# Patient Record
Sex: Female | Born: 1945 | Race: White | Hispanic: No | Marital: Married | Smoking: Never smoker
Health system: Southern US, Community
[De-identification: ages and names within clinical notes are randomized; demographics above are authoritative.]

## PROBLEM LIST (undated history)

## (undated) DIAGNOSIS — R002 Palpitations: Secondary | ICD-10-CM

## (undated) DIAGNOSIS — I38 Endocarditis, valve unspecified: Secondary | ICD-10-CM

## (undated) DIAGNOSIS — E785 Hyperlipidemia, unspecified: Secondary | ICD-10-CM

## (undated) DIAGNOSIS — R569 Unspecified convulsions: Secondary | ICD-10-CM

## (undated) DIAGNOSIS — I10 Essential (primary) hypertension: Secondary | ICD-10-CM

## (undated) DIAGNOSIS — R079 Chest pain, unspecified: Secondary | ICD-10-CM

## (undated) DIAGNOSIS — I5189 Other ill-defined heart diseases: Secondary | ICD-10-CM

## (undated) DIAGNOSIS — I340 Nonrheumatic mitral (valve) insufficiency: Secondary | ICD-10-CM

## (undated) HISTORY — DX: Hyperlipidemia, unspecified: E78.5

## (undated) HISTORY — DX: Palpitations: R00.2

## (undated) HISTORY — PX: ABDOMINAL HYSTERECTOMY: SHX81

## (undated) HISTORY — DX: Nonrheumatic mitral (valve) insufficiency: I34.0

## (undated) HISTORY — DX: Endocarditis, valve unspecified: I38

## (undated) HISTORY — PX: TUBAL LIGATION: SHX77

## (undated) HISTORY — DX: Other ill-defined heart diseases: I51.89

## (undated) HISTORY — DX: Chest pain, unspecified: R07.9

---

## 1999-02-07 ENCOUNTER — Encounter: Payer: Self-pay | Admitting: Family Medicine

## 1999-02-07 ENCOUNTER — Ambulatory Visit (HOSPITAL_COMMUNITY): Admission: RE | Admit: 1999-02-07 | Discharge: 1999-02-07 | Payer: Self-pay | Admitting: Family Medicine

## 1999-08-30 ENCOUNTER — Ambulatory Visit (HOSPITAL_COMMUNITY): Admission: RE | Admit: 1999-08-30 | Discharge: 1999-08-30 | Payer: Self-pay | Admitting: Family Medicine

## 1999-08-30 ENCOUNTER — Encounter: Payer: Self-pay | Admitting: Family Medicine

## 2000-08-31 ENCOUNTER — Encounter: Payer: Self-pay | Admitting: Family Medicine

## 2000-08-31 ENCOUNTER — Ambulatory Visit (HOSPITAL_COMMUNITY): Admission: RE | Admit: 2000-08-31 | Discharge: 2000-08-31 | Payer: Self-pay | Admitting: Family Medicine

## 2000-11-19 ENCOUNTER — Other Ambulatory Visit: Admission: RE | Admit: 2000-11-19 | Discharge: 2000-11-19 | Payer: Self-pay | Admitting: Family Medicine

## 2001-09-05 ENCOUNTER — Encounter: Payer: Self-pay | Admitting: Family Medicine

## 2001-09-05 ENCOUNTER — Ambulatory Visit (HOSPITAL_COMMUNITY): Admission: RE | Admit: 2001-09-05 | Discharge: 2001-09-05 | Payer: Self-pay | Admitting: Family Medicine

## 2003-09-08 ENCOUNTER — Other Ambulatory Visit: Admission: RE | Admit: 2003-09-08 | Discharge: 2003-09-08 | Payer: Self-pay | Admitting: Family Medicine

## 2004-11-01 ENCOUNTER — Other Ambulatory Visit: Admission: RE | Admit: 2004-11-01 | Discharge: 2004-11-01 | Payer: Self-pay | Admitting: Family Medicine

## 2006-01-17 ENCOUNTER — Other Ambulatory Visit: Admission: RE | Admit: 2006-01-17 | Discharge: 2006-01-17 | Payer: Self-pay | Admitting: Family Medicine

## 2007-01-28 ENCOUNTER — Other Ambulatory Visit: Admission: RE | Admit: 2007-01-28 | Discharge: 2007-01-28 | Payer: Self-pay | Admitting: Family Medicine

## 2008-02-03 ENCOUNTER — Other Ambulatory Visit: Admission: RE | Admit: 2008-02-03 | Discharge: 2008-02-03 | Payer: Self-pay | Admitting: Family Medicine

## 2009-02-03 ENCOUNTER — Other Ambulatory Visit: Admission: RE | Admit: 2009-02-03 | Discharge: 2009-02-03 | Payer: Self-pay | Admitting: Family Medicine

## 2014-02-17 ENCOUNTER — Emergency Department (HOSPITAL_COMMUNITY)
Admission: EM | Admit: 2014-02-17 | Discharge: 2014-02-17 | Disposition: A | Discharge status: Home / Self Care | Payer: Worker's Compensation | Attending: Emergency Medicine | Admitting: Emergency Medicine

## 2014-02-17 ENCOUNTER — Encounter (HOSPITAL_COMMUNITY): Payer: Self-pay | Admitting: Emergency Medicine

## 2014-02-17 ENCOUNTER — Emergency Department (HOSPITAL_COMMUNITY): Payer: Worker's Compensation

## 2014-02-17 DIAGNOSIS — S60459A Superficial foreign body of unspecified finger, initial encounter: Secondary | ICD-10-CM

## 2014-02-17 DIAGNOSIS — Y9389 Activity, other specified: Secondary | ICD-10-CM | POA: Insufficient documentation

## 2014-02-17 DIAGNOSIS — Y929 Unspecified place or not applicable: Secondary | ICD-10-CM | POA: Diagnosis not present

## 2014-02-17 DIAGNOSIS — W268XXA Contact with other sharp object(s), not elsewhere classified, initial encounter: Secondary | ICD-10-CM | POA: Diagnosis not present

## 2014-02-17 DIAGNOSIS — I10 Essential (primary) hypertension: Secondary | ICD-10-CM | POA: Diagnosis not present

## 2014-02-17 DIAGNOSIS — Z7982 Long term (current) use of aspirin: Secondary | ICD-10-CM | POA: Insufficient documentation

## 2014-02-17 DIAGNOSIS — Z79899 Other long term (current) drug therapy: Secondary | ICD-10-CM | POA: Diagnosis not present

## 2014-02-17 HISTORY — DX: Unspecified convulsions: R56.9

## 2014-02-17 HISTORY — DX: Essential (primary) hypertension: I10

## 2014-02-17 NOTE — ED Notes (Signed)
Per pt at work, dropped needle.  Went to grab and needle stuck into right index finger.  Object remains.  Hook imbedded at the end.

## 2014-02-17 NOTE — Discharge Instructions (Signed)
Sliver Removal °You have had a sliver (splinter) removed. This has caused a wound that extends through some or all layers of the skin and possibly into the subcutaneous tissue. This is the tissue just beneath the skin. Because these wounds can not be cleaned well, it is necessary to watch closely for infection. °AFTER THE PROCEDURE  °If a cut (incision) was necessary to remove this, it may have been repaired for you by your caregiver either with suturing, stapling, or adhesive strips. These keep together the skin edges and allow better and faster healing. °HOME CARE INSTRUCTIONS  °· A dressing may have been applied. This may be changed once per day or as instructed. If the dressing sticks, it may be soaked off with a gauze pad or clean cloth that has been dampened with soapy water or hydrogen peroxide. °· It is difficult to remove all slivers or foreign bodies as they may break or splinter into smaller pieces. Be aware that your body will work to remove the foreign substance. That is, the foreign body may work itself out of the wound. That is normal. °· Watch for signs of infection and notify your caregiver if you suspect a sliver or foreign body remains in the wound. °· You may have received a recommendation to follow up with your physician or a specialist. It is very important to call for or keep follow-up appointments in order to avoid infection or other complications. °· Only take over-the-counter or prescription medicines for pain, discomfort, or fever as directed by your caregiver. °· If antibiotics were prescribed, be sure to finish all of the medicine. °If you did not receive a tetanus shot today because you did not recall when your last one was given, check with your caregiver in the next day or two during follow up to determine if one is needed. °SEEK MEDICAL CARE IF:  °· The area around the wound has new or worsening redness or tenderness. °· Pus is coming from the wound °· There is a foul smell from the  wound or dressing °· The edges of a wound that had been repaired break open °SEEK IMMEDIATE MEDICAL CARE IF:  °· Red streaks are coming from the wound °· An unexplained oral temperature above 102° F (38.9° C) develops. °Document Released: 08/11/2000 Document Revised: 11/06/2011 Document Reviewed: 03/30/2008 °[employer]® Patient Information ©2015 [employer]. This information is not intended to replace advice given to you by your health care provider. Make sure you discuss any questions you have with your health care provider. ° °

## 2014-02-17 NOTE — ED Provider Notes (Signed)
CSN: [id number]     Arrival date & time 02/17/14  0755 History   First MD Initiated Contact with Patient 02/17/14 0818     Chief Complaint  Patient presents with  . Foreign Body  . finger numbness      (Consider location/radiation/quality/duration/timing/severity/associated sxs/prior Treatment) Patient is a 68 y.o. female presenting with foreign body. The history is provided by the patient.  Foreign Body  patient has a knitting hook stuck in her right index finger. She states she was dropping and reached for it it got stuck in her hand. Tetanus is up-to-date. She states she has some numbness in her index finger. No other injury. She is able to move the finger.  Past Medical History  Diagnosis Date  . Hypertension   . Seizures    Past Surgical History  Procedure Laterality Date  . Abdominal hysterectomy     History reviewed. No pertinent family history. History  Substance Use Topics  . Smoking status: Never Smoker   . Smokeless tobacco: Not on file  . Alcohol Use: No   OB History   Grav Para Term Preterm Abortions TAB SAB Ect Mult Living                 Review of Systems  Constitutional: Negative for fever.  Musculoskeletal: Negative for joint swelling.  Skin: Positive for wound.  Neurological: Positive for numbness. Negative for weakness.      Allergies  Review of patient's allergies indicates no known allergies.  Home Medications   Prior to Admission medications   Medication Sig Start Date End Date Taking? Authorizing Provider  amLODipine (NORVASC) 5 MG tablet Take 5 mg by mouth every morning.   Yes Historical Provider, MD  aspirin EC 81 MG tablet Take 81 mg by mouth every morning.   Yes Historical Provider, MD  carbamazepine (TEGRETOL XR) 200 MG 12 hr tablet Take 200 mg by mouth at bedtime.   Yes Historical Provider, MD  carvedilol (COREG) 3.125 MG tablet Take 3.125 mg by mouth 2 (two) times daily with a meal.   Yes Historical Provider, MD  ferrous fumarate  (HEMOCYTE - 106 MG FE) 325 (106 FE) MG TABS tablet Take 1 tablet by mouth every morning.   Yes Historical Provider, MD  valsartan (DIOVAN) 320 MG tablet Take 320 mg by mouth at bedtime.   Yes Historical Provider, MD   BP 185/79  Pulse 55  Temp(Src) 97.9 F (36.6 C) (Oral)  Resp 18  SpO2 100% Physical Exam  Constitutional: She is oriented to person, place, and time. She appears well-developed.  Cardiovascular: Normal rate and regular rhythm.   Pulmonary/Chest: Effort normal and breath sounds normal.  Musculoskeletal:  Foreign body into 4 aspect of right index finger at the PIP joint. Able to flex and extend at the PIP and DIP joint. Sensation grossly intact to possibly mildly decreased over distal index finger. Joints are stable.  Neurological: She is alert and oriented to person, place, and time.    ED Course  FOREIGN BODY REMOVAL Date/Time: 02/17/2014 9:00 AM Performed by: Benjiman CorePICKERING, NATHAN R. Authorized by: Billee CashingPICKERING, NATHAN R. Consent: Verbal consent obtained. written consent not obtained. Risks and benefits: risks, benefits and alternatives were discussed Consent given by: patient Patient understanding: patient states understanding of the procedure being performed Patient consent: the patient's understanding of the procedure matches consent given Relevant documents: relevant documents present and verified Site marked: the operative site was marked Imaging studies: imaging studies available Required items: required blood  products, implants, devices, and special equipment available Patient identity confirmed: verbally with patient Time out: Immediately prior to procedure a "time out" was called to verify the correct patient, procedure, equipment, support staff and site/side marked as required. Body area: skin General location: upper extremity Location details: right index finger Anesthesia: digital block Local anesthetic: lidocaine 2% without epinephrine Anesthetic total: 1.5  ml Patient sedated: no Patient restrained: no Patient cooperative: yes Localization method: visualized Removal mechanism: 18-gauge needle inserted a long course of hook to cover barb in both removed with traction. Dressing: antibiotic ointment and dressing applied Tendon involvement: none Depth: subcutaneous Complexity: simple 1 objects recovered. Objects recovered: Knitting hook Post-procedure assessment: foreign body removed Patient tolerance: Patient tolerated the procedure well with no immediate complications.   (including critical care time) Labs Review Labs Reviewed - No data to display  Imaging Review Dg Finger Index Right  02/17/2014   CLINICAL DATA:  68 year old female with penetrating trauma, knitting hook impacted in the right index finger. Initial encounter.  EXAM: RIGHT INDEX FINGER 2+V  COMPARISON:  None.  FINDINGS: Small metallic E elongated hook projects at the ulnar aspect of the second finger, terminating at the ulnar base of the second middle phalanx near the PIP. No fracture of the adjacent osseous structures. No subcutaneous gas identified. Joint spaces and alignment are preserved.  IMPRESSION: Impacted metallic hook, deep aspect adjacent to the base of the second proximal phalanx. No underlying fracture.   Electronically Signed   By: Augusto GambleLee  Hall M.D.   On: 02/17/2014 08:40     EKG Interpretation None      MDM   Final diagnoses:  Foreign body of finger of right hand, initial encounter    Patient was knitting put in finger. No apparent joint involvement. Tendon function appears intact. Gait did not move with flexion-extension of the finger while it was in place. Will discharge home. Tetanus is up-to-date    Juliet Rudeathan R. Rubin PayorPickering, MD 02/17/14 [phone]

## 2015-02-02 ENCOUNTER — Other Ambulatory Visit: Payer: Self-pay | Admitting: Family Medicine

## 2015-02-02 DIAGNOSIS — M79661 Pain in right lower leg: Secondary | ICD-10-CM

## 2015-02-02 DIAGNOSIS — M79662 Pain in left lower leg: Secondary | ICD-10-CM

## 2015-02-08 ENCOUNTER — Ambulatory Visit
Admission: RE | Admit: 2015-02-08 | Discharge: 2015-02-08 | Disposition: A | Discharge status: Home / Self Care | Payer: 59 | Source: Ambulatory Visit | Attending: Family Medicine | Admitting: Family Medicine

## 2015-02-08 DIAGNOSIS — M79661 Pain in right lower leg: Secondary | ICD-10-CM

## 2015-02-08 DIAGNOSIS — M79662 Pain in left lower leg: Secondary | ICD-10-CM

## 2015-02-10 ENCOUNTER — Encounter (HOSPITAL_COMMUNITY): Payer: Self-pay | Admitting: Emergency Medicine

## 2015-02-10 ENCOUNTER — Emergency Department (HOSPITAL_COMMUNITY)
Admission: EM | Admit: 2015-02-10 | Discharge: 2015-02-10 | Disposition: A | Discharge status: Home / Self Care | Payer: 59 | Attending: Emergency Medicine | Admitting: Emergency Medicine

## 2015-02-10 DIAGNOSIS — Z79899 Other long term (current) drug therapy: Secondary | ICD-10-CM | POA: Insufficient documentation

## 2015-02-10 DIAGNOSIS — H538 Other visual disturbances: Secondary | ICD-10-CM | POA: Diagnosis not present

## 2015-02-10 DIAGNOSIS — G40909 Epilepsy, unspecified, not intractable, without status epilepticus: Secondary | ICD-10-CM | POA: Diagnosis not present

## 2015-02-10 DIAGNOSIS — Z7982 Long term (current) use of aspirin: Secondary | ICD-10-CM | POA: Insufficient documentation

## 2015-02-10 DIAGNOSIS — I10 Essential (primary) hypertension: Secondary | ICD-10-CM | POA: Diagnosis not present

## 2015-02-10 DIAGNOSIS — R42 Dizziness and giddiness: Secondary | ICD-10-CM | POA: Diagnosis present

## 2015-02-10 DIAGNOSIS — I160 Hypertensive urgency: Secondary | ICD-10-CM

## 2015-02-10 LAB — BASIC METABOLIC PANEL
Anion gap: 6 (ref 5–15)
BUN: 8 mg/dL (ref 6–20)
CALCIUM: 9.4 mg/dL (ref 8.9–10.3)
CHLORIDE: 99 mmol/L — AB (ref 101–111)
CO2: 29 mmol/L (ref 22–32)
CREATININE: 0.71 mg/dL (ref 0.44–1.00)
GFR calc Af Amer: >60 mL/min (ref >60)
GFR calc non Af Amer: >60 mL/min (ref >60)
GLUCOSE: 114 mg/dL — AB (ref 65–99)
Potassium: 3.9 mmol/L (ref 3.5–5.1)
Sodium: 134 mmol/L — ABNORMAL LOW (ref 135–145)

## 2015-02-10 LAB — CBC
HEMATOCRIT: 41.5 % (ref 36.0–46.0)
Hemoglobin: 14.5 g/dL (ref 12.0–15.0)
MCH: 30 pg (ref 26.0–34.0)
MCHC: 34.9 g/dL (ref 30.0–36.0)
MCV: 85.9 fL (ref 78.0–100.0)
PLATELETS: 206 10*3/uL (ref 150–400)
RBC: 4.83 MIL/uL (ref 3.87–5.11)
RDW: 13 % (ref 11.5–15.5)
WBC: 6 10*3/uL (ref 4.0–10.5)

## 2015-02-10 LAB — I-STAT TROPONIN, ED: TROPONIN I, POC: 0 ng/mL (ref 0.00–0.08)

## 2015-02-10 LAB — BRAIN NATRIURETIC PEPTIDE: B NATRIURETIC PEPTIDE 5: 107.3 pg/mL — AB (ref 0.0–100.0)

## 2015-02-10 NOTE — ED Provider Notes (Signed)
CSN: [id number]     Arrival date & time 02/10/15  1304 History   First MD Initiated Contact with Patient 02/10/15 1317     Chief Complaint  Patient presents with  . Dizziness  . Blurred Vision   HPI Heidi Carter is a 69yo woman with PMHx of HTN and seizures who presents to the ED with lightheadedness and blurry vision in both eyes. Patient reports her symptoms started around 12pm while she was at work. She notes someone at work checked her BP and the systolic number was >200. She states her symptoms have completely resolved now. She reports she takes BP medications and took her last dose last night. She denies any chest pain, but does report some shortness of breath, abdominal pain, and nausea. She denies any weakness, changes in speech, numbness, or tingling.    Past Medical History  Diagnosis Date  . Hypertension   . Seizures    Past Surgical History  Procedure Laterality Date  . Abdominal hysterectomy     History reviewed. No pertinent family history. History  Substance Use Topics  . Smoking status: Never Smoker   . Smokeless tobacco: Not on file  . Alcohol Use: No   OB History    No data available     Review of Systems General: Denies fever, chills, night sweats, changes in weight, changes in appetite HEENT: Denies headaches, ear pain, rhinorrhea, sore throat CV: Denies palpitations, orthopnea Pulm: Denies cough, wheezing GI: Denies diarrhea, constipation, melena, hematochezia GU: Denies dysuria, hematuria, frequency Msk: Denies muscle cramps, joint pains Neuro: See HPI Skin: Denies rashes, bruising   Allergies  Review of patient's allergies indicates no known allergies.  Home Medications   Prior to Admission medications   Medication Sig Start Date End Date Taking? Authorizing Provider  amLODipine (NORVASC) 5 MG tablet Take 5 mg by mouth every morning.    Historical Provider, MD  aspirin EC 81 MG tablet Take 81 mg by mouth every morning.    Historical Provider,  MD  carbamazepine (TEGRETOL XR) 200 MG 12 hr tablet Take 200 mg by mouth at bedtime.    Historical Provider, MD  carvedilol (COREG) 3.125 MG tablet Take 3.125 mg by mouth 2 (two) times daily with a meal.    Historical Provider, MD  ferrous fumarate (HEMOCYTE - 106 MG FE) 325 (106 FE) MG TABS tablet Take 1 tablet by mouth every morning.    Historical Provider, MD  valsartan (DIOVAN) 320 MG tablet Take 320 mg by mouth at bedtime.    Historical Provider, MD   BP 165/106 mmHg  Pulse 57  Temp(Src) 98.2 F (36.8 C) (Oral)  Resp 16  SpO2 100% Physical Exam General: elderly woman sitting up in bed, NAD HEENT: [place]/AT, EOMI, PERRL. No retinal hemorrhages observed on ophtho exam. Mucus membranes moist.  CV: RRR, no m/g/r Pulm: CTA bilaterally, breaths non-labored Abd: BS+, soft, non-tender, non-distended Ext: warm, no edema Neuro: alert and oriented x 3. EOMI. No visual field defects. Smile symmetric. Tongue does not deviate. Shoulder shrug intact. Finger to nose normal. Strength 5/5 in upper and lower extremities bilaterally. Sensation to light touch symmetric and intact.   ED Course  Procedures (including critical care time) Labs Review Labs Reviewed  BASIC METABOLIC PANEL - Abnormal; Notable for the following:    Sodium 134 (*)    Chloride 99 (*)    Glucose, Bld 114 (*)    All other components within normal limits  BRAIN NATRIURETIC PEPTIDE - Abnormal; Notable  for the following:    B Natriuretic Peptide 107.3 (*)    All other components within normal limits  CBC  I-STAT TROPOININ, ED    Imaging Review US Arterial Seg Multiple  02/09/2015   CLINICAL DATA:  69 year old female with bilateral (left greater than right) calf pain while walking which is relieved with rest consistent with claudication.  EXAM: NONINVASIVE PHYSIOLOGIC VASCULAR STUDY OF BILATERAL LOWER EXTREMITIES  TECHNIQUE: Evaluation of both lower extremities was performed at rest, including calculation of ankle-brachial  indices, multiple segmental pressure evaluation, segmental Doppler and segmental pulse volume recording.  COMPARISON:  None.  FINDINGS: Right ABI:  1.1  Left ABI:  1.1  Right Lower Extremity: Normal triphasic waveforms throughout the right lower extremity. No focal pressure differential on the segmental pressures to suggest focal stenosis. Normal PVRs.  Left Lower Extremity: Normal triphasic waveforms throughout the left lower extremity. No focal pressure differential on the segmental pressures to suggest focal stenosis. Normal PVRs.  IMPRESSION: Normal bilateral ankle-brachial indices, arterial waveforms, segmental pressures and pulse volume recordings. No evidence of clinically significant peripheral arterial disease.  Signed,  Sterling Big, MD  [facility]  [facility]   Electronically Signed   By: Malachy Moan M.D.   On: 02/09/2015 08:36     EKG Interpretation   Date/Time:  Wednesday February 10 2015 13:08:37 EDT Ventricular Rate:  57 PR Interval:  148 QRS Duration: 78 QT Interval:  414 QTC Calculation: 402 R Axis:   60 Text Interpretation:  Sinus bradycardia Possible Left atrial enlargement  Nonspecific ST abnormality Abnormal ECG No old tracing to compare  Confirmed by GOLDSTON  MD, SCOTT ([id number]) on 02/10/2015 1:28:44 PM      MDM   Final diagnoses:  Hypertensive urgency    69yo woman presenting with lightheadedness and bilateral blurry vision after having a transient blood pressure elevation to greater than 200 systolic. Symptoms have completely resolved now. Neurological exam nonfocal. However, EKG with sinus bradycardia and some ST depressions in the inferior and lateral leads. Troponin negative x 1. BP is stable in 130s-140s systolic.   Repeat EKG shows persistent changes.   Her symptoms were likely related to hypertensive urgency. Discussed EKG changes with cardiology and recommend for patient to have an echocardiogram in the  next few weeks and to keep BP controlled. Patient recommended to see her primary care doctor in the next 1-2 weeks for BP control.    Su Hoff, MD 02/10/15 1542  Pricilla Loveless, MD 02/11/15 [phone]

## 2015-02-10 NOTE — ED Notes (Signed)
Pt reports she was at work began feeling lightheaded around 12pm, experienced blurry vision. Did not pass out. Stroke scale neg. Pt awake, alert, oriented x4, vision improved from earlier.

## 2015-02-10 NOTE — Discharge Instructions (Signed)
- Your symptoms of blurry vision and lightheadedness were related to your high blood pressure. - You will need to have an echocardiogram within the next few weeks - Blood pressure control will be very important for controlling your symptoms - Please see your primary care doctor in the next 1-2 weeks  Hypertension Hypertension, commonly called high blood pressure, is when the force of blood pumping through your arteries is too strong. Your arteries are the blood vessels that carry blood from your heart throughout your body. A blood pressure reading consists of a higher number over a lower number, such as 110/72. The higher number (systolic) is the pressure inside your arteries when your heart pumps. The lower number (diastolic) is the pressure inside your arteries when your heart relaxes. Ideally you want your blood pressure below 120/80. Hypertension forces your heart to work harder to pump blood. Your arteries may become narrow or stiff. Having hypertension puts you at risk for heart disease, stroke, and other problems.  RISK FACTORS Some risk factors for high blood pressure are controllable. Others are not.  Risk factors you cannot control include:   Race. You may be at higher risk if you are African American.  Age. Risk increases with age.  Gender. Men are at higher risk than women before age 26 years. After age 53, women are at higher risk than men. Risk factors you can control include:  Not getting enough exercise or physical activity.  Being overweight.  Getting too much fat, sugar, calories, or salt in your diet.  Drinking too much alcohol. SIGNS AND SYMPTOMS Hypertension does not usually cause signs or symptoms. Extremely high blood pressure (hypertensive crisis) may cause headache, anxiety, shortness of breath, and nosebleed. DIAGNOSIS  To check if you have hypertension, your health care provider will measure your blood pressure while you are seated, with your arm held at the  level of your heart. It should be measured at least twice using the same arm. Certain conditions can cause a difference in blood pressure between your right and left arms. A blood pressure reading that is higher than normal on one occasion does not mean that you need treatment. If one blood pressure reading is high, ask your health care provider about having it checked again. TREATMENT  Treating high blood pressure includes making lifestyle changes and possibly taking medicine. Living a healthy lifestyle can help lower high blood pressure. You may need to change some of your habits. Lifestyle changes may include:  Following the DASH diet. This diet is high in fruits, vegetables, and whole grains. It is low in salt, red meat, and added sugars.  Getting at least 2 hours of brisk physical activity every week.  Losing weight if necessary.  Not smoking.  Limiting alcoholic beverages.  Learning ways to reduce stress. If lifestyle changes are not enough to get your blood pressure under control, your health care provider may prescribe medicine. You may need to take more than one. Work closely with your health care provider to understand the risks and benefits. HOME CARE INSTRUCTIONS  Have your blood pressure rechecked as directed by your health care provider.   Take medicines only as directed by your health care provider. Follow the directions carefully. Blood pressure medicines must be taken as prescribed. The medicine does not work as well when you skip doses. Skipping doses also puts you at risk for problems.   Do not smoke.   Monitor your blood pressure at home as directed by your health  care provider. SEEK MEDICAL CARE IF:   You think you are having a reaction to medicines taken.  You have recurrent headaches or feel dizzy.  You have swelling in your ankles.  You have trouble with your vision. SEEK IMMEDIATE MEDICAL CARE IF:  You develop a severe headache or confusion.  You  have unusual weakness, numbness, or feel faint.  You have severe chest or abdominal pain.  You vomit repeatedly.  You have trouble breathing. MAKE SURE YOU:   Understand these instructions.  Will watch your condition.  Will get help right away if you are not doing well or get worse. Document Released: 08/14/2005 Document Revised: 12/29/2013 Document Reviewed: 06/06/2013 [facility] Patient Information 2015 [place], [place]. This information is not intended to replace advice given to you by your health care provider. Make sure you discuss any questions you have with your health care provider.

## 2015-03-23 ENCOUNTER — Other Ambulatory Visit: Payer: Self-pay | Admitting: Cardiology

## 2015-03-23 ENCOUNTER — Ambulatory Visit (HOSPITAL_COMMUNITY)
Admission: RE | Admit: 2015-03-23 | Discharge: 2015-03-23 | Disposition: A | Discharge status: Home / Self Care | Payer: 59 | Source: Ambulatory Visit | Attending: Vascular Surgery | Admitting: Vascular Surgery

## 2015-03-23 DIAGNOSIS — I10 Essential (primary) hypertension: Secondary | ICD-10-CM | POA: Insufficient documentation

## 2015-03-23 DIAGNOSIS — R569 Unspecified convulsions: Secondary | ICD-10-CM | POA: Insufficient documentation

## 2015-07-05 ENCOUNTER — Other Ambulatory Visit: Payer: Self-pay

## 2015-07-05 ENCOUNTER — Ambulatory Visit
Admission: RE | Admit: 2015-07-05 | Discharge: 2015-07-05 | Disposition: A | Discharge status: Home / Self Care | Payer: 59 | Source: Ambulatory Visit

## 2015-07-05 DIAGNOSIS — Z1231 Encounter for screening mammogram for malignant neoplasm of breast: Secondary | ICD-10-CM

## 2016-07-31 DIAGNOSIS — I119 Hypertensive heart disease without heart failure: Secondary | ICD-10-CM | POA: Diagnosis not present

## 2016-07-31 DIAGNOSIS — I351 Nonrheumatic aortic (valve) insufficiency: Secondary | ICD-10-CM | POA: Diagnosis not present

## 2016-07-31 DIAGNOSIS — R9431 Abnormal electrocardiogram [ECG] [EKG]: Secondary | ICD-10-CM | POA: Diagnosis not present

## 2016-07-31 DIAGNOSIS — I10 Essential (primary) hypertension: Secondary | ICD-10-CM | POA: Diagnosis not present

## 2016-09-06 ENCOUNTER — Other Ambulatory Visit: Payer: Self-pay | Admitting: Family Medicine

## 2016-09-06 DIAGNOSIS — Z1231 Encounter for screening mammogram for malignant neoplasm of breast: Secondary | ICD-10-CM

## 2016-09-19 DIAGNOSIS — E782 Mixed hyperlipidemia: Secondary | ICD-10-CM | POA: Diagnosis not present

## 2016-09-19 DIAGNOSIS — R7303 Prediabetes: Secondary | ICD-10-CM | POA: Diagnosis not present

## 2016-09-19 DIAGNOSIS — Z0000 Encounter for general adult medical examination without abnormal findings: Secondary | ICD-10-CM | POA: Diagnosis not present

## 2016-09-19 DIAGNOSIS — I10 Essential (primary) hypertension: Secondary | ICD-10-CM | POA: Diagnosis not present

## 2016-09-19 DIAGNOSIS — G40909 Epilepsy, unspecified, not intractable, without status epilepticus: Secondary | ICD-10-CM | POA: Diagnosis not present

## 2016-09-19 DIAGNOSIS — Z1389 Encounter for screening for other disorder: Secondary | ICD-10-CM | POA: Diagnosis not present

## 2016-10-09 ENCOUNTER — Encounter: Payer: Self-pay | Admitting: Radiology

## 2016-10-09 ENCOUNTER — Ambulatory Visit
Admission: RE | Admit: 2016-10-09 | Discharge: 2016-10-09 | Disposition: A | Discharge status: Home / Self Care | Payer: Medicare HMO | Source: Ambulatory Visit | Attending: Family Medicine | Admitting: Family Medicine

## 2016-10-09 DIAGNOSIS — Z1231 Encounter for screening mammogram for malignant neoplasm of breast: Secondary | ICD-10-CM | POA: Diagnosis not present

## 2016-11-17 DIAGNOSIS — E7800 Pure hypercholesterolemia, unspecified: Secondary | ICD-10-CM | POA: Diagnosis not present

## 2016-11-28 DIAGNOSIS — Z1211 Encounter for screening for malignant neoplasm of colon: Secondary | ICD-10-CM | POA: Diagnosis not present

## 2017-04-18 DIAGNOSIS — E782 Mixed hyperlipidemia: Secondary | ICD-10-CM | POA: Diagnosis not present

## 2017-04-18 DIAGNOSIS — K219 Gastro-esophageal reflux disease without esophagitis: Secondary | ICD-10-CM | POA: Diagnosis not present

## 2017-04-18 DIAGNOSIS — I10 Essential (primary) hypertension: Secondary | ICD-10-CM | POA: Diagnosis not present

## 2017-04-18 DIAGNOSIS — Z23 Encounter for immunization: Secondary | ICD-10-CM | POA: Diagnosis not present

## 2017-04-18 DIAGNOSIS — R7303 Prediabetes: Secondary | ICD-10-CM | POA: Diagnosis not present

## 2017-04-18 DIAGNOSIS — G40909 Epilepsy, unspecified, not intractable, without status epilepticus: Secondary | ICD-10-CM | POA: Diagnosis not present

## 2017-06-11 DIAGNOSIS — H18413 Arcus senilis, bilateral: Secondary | ICD-10-CM | POA: Diagnosis not present

## 2017-06-11 DIAGNOSIS — H11423 Conjunctival edema, bilateral: Secondary | ICD-10-CM | POA: Diagnosis not present

## 2017-06-11 DIAGNOSIS — H04123 Dry eye syndrome of bilateral lacrimal glands: Secondary | ICD-10-CM | POA: Diagnosis not present

## 2017-06-11 DIAGNOSIS — H11153 Pinguecula, bilateral: Secondary | ICD-10-CM | POA: Diagnosis not present

## 2017-06-11 DIAGNOSIS — H5203 Hypermetropia, bilateral: Secondary | ICD-10-CM | POA: Diagnosis not present

## 2017-06-11 DIAGNOSIS — H40033 Anatomical narrow angle, bilateral: Secondary | ICD-10-CM | POA: Diagnosis not present

## 2017-06-11 DIAGNOSIS — H2513 Age-related nuclear cataract, bilateral: Secondary | ICD-10-CM | POA: Diagnosis not present

## 2017-06-11 DIAGNOSIS — H25013 Cortical age-related cataract, bilateral: Secondary | ICD-10-CM | POA: Diagnosis not present

## 2017-06-11 DIAGNOSIS — H40023 Open angle with borderline findings, high risk, bilateral: Secondary | ICD-10-CM | POA: Diagnosis not present

## 2017-07-09 DIAGNOSIS — H40033 Anatomical narrow angle, bilateral: Secondary | ICD-10-CM | POA: Diagnosis not present

## 2017-07-09 DIAGNOSIS — H40013 Open angle with borderline findings, low risk, bilateral: Secondary | ICD-10-CM | POA: Diagnosis not present

## 2017-07-09 DIAGNOSIS — H2513 Age-related nuclear cataract, bilateral: Secondary | ICD-10-CM | POA: Diagnosis not present

## 2017-08-08 DIAGNOSIS — M791 Myalgia, unspecified site: Secondary | ICD-10-CM | POA: Diagnosis not present

## 2017-08-27 DIAGNOSIS — I351 Nonrheumatic aortic (valve) insufficiency: Secondary | ICD-10-CM | POA: Diagnosis not present

## 2017-08-27 DIAGNOSIS — I119 Hypertensive heart disease without heart failure: Secondary | ICD-10-CM | POA: Diagnosis not present

## 2017-09-19 ENCOUNTER — Other Ambulatory Visit: Payer: Self-pay | Admitting: Family Medicine

## 2017-09-19 DIAGNOSIS — Z23 Encounter for immunization: Secondary | ICD-10-CM | POA: Diagnosis not present

## 2017-09-19 DIAGNOSIS — R7303 Prediabetes: Secondary | ICD-10-CM | POA: Diagnosis not present

## 2017-09-19 DIAGNOSIS — Z13820 Encounter for screening for osteoporosis: Secondary | ICD-10-CM | POA: Diagnosis not present

## 2017-09-19 DIAGNOSIS — E782 Mixed hyperlipidemia: Secondary | ICD-10-CM | POA: Diagnosis not present

## 2017-09-19 DIAGNOSIS — Z0000 Encounter for general adult medical examination without abnormal findings: Secondary | ICD-10-CM | POA: Diagnosis not present

## 2017-09-19 DIAGNOSIS — Z1231 Encounter for screening mammogram for malignant neoplasm of breast: Secondary | ICD-10-CM

## 2017-09-19 DIAGNOSIS — G40909 Epilepsy, unspecified, not intractable, without status epilepticus: Secondary | ICD-10-CM | POA: Diagnosis not present

## 2017-09-19 DIAGNOSIS — Z1389 Encounter for screening for other disorder: Secondary | ICD-10-CM | POA: Diagnosis not present

## 2017-09-19 DIAGNOSIS — I10 Essential (primary) hypertension: Secondary | ICD-10-CM | POA: Diagnosis not present

## 2017-10-11 ENCOUNTER — Ambulatory Visit
Admission: RE | Admit: 2017-10-11 | Discharge: 2017-10-11 | Disposition: A | Discharge status: Home / Self Care | Payer: Medicare HMO | Source: Ambulatory Visit | Attending: Family Medicine | Admitting: Family Medicine

## 2017-10-11 DIAGNOSIS — Z1231 Encounter for screening mammogram for malignant neoplasm of breast: Secondary | ICD-10-CM | POA: Diagnosis not present

## 2018-01-29 DIAGNOSIS — S29012A Strain of muscle and tendon of back wall of thorax, initial encounter: Secondary | ICD-10-CM | POA: Diagnosis not present

## 2018-03-19 DIAGNOSIS — R531 Weakness: Secondary | ICD-10-CM | POA: Diagnosis not present

## 2018-03-19 DIAGNOSIS — E782 Mixed hyperlipidemia: Secondary | ICD-10-CM | POA: Diagnosis not present

## 2018-03-19 DIAGNOSIS — I10 Essential (primary) hypertension: Secondary | ICD-10-CM | POA: Diagnosis not present

## 2018-03-19 DIAGNOSIS — R200 Anesthesia of skin: Secondary | ICD-10-CM | POA: Diagnosis not present

## 2018-03-19 DIAGNOSIS — G40909 Epilepsy, unspecified, not intractable, without status epilepticus: Secondary | ICD-10-CM | POA: Diagnosis not present

## 2018-03-20 ENCOUNTER — Other Ambulatory Visit: Payer: Self-pay | Admitting: Family Medicine

## 2018-03-20 ENCOUNTER — Ambulatory Visit
Admission: RE | Admit: 2018-03-20 | Discharge: 2018-03-20 | Disposition: A | Discharge status: Home / Self Care | Payer: Medicare HMO | Source: Ambulatory Visit | Attending: Family Medicine | Admitting: Family Medicine

## 2018-03-20 DIAGNOSIS — R200 Anesthesia of skin: Secondary | ICD-10-CM

## 2018-03-20 DIAGNOSIS — M4186 Other forms of scoliosis, lumbar region: Secondary | ICD-10-CM | POA: Diagnosis not present

## 2018-05-21 DIAGNOSIS — Z23 Encounter for immunization: Secondary | ICD-10-CM | POA: Diagnosis not present

## 2018-05-21 DIAGNOSIS — I10 Essential (primary) hypertension: Secondary | ICD-10-CM | POA: Diagnosis not present

## 2018-07-17 ENCOUNTER — Ambulatory Visit: Payer: Self-pay | Admitting: Cardiology

## 2018-08-11 NOTE — Progress Notes (Signed)
Cardiology Office Note  Date:  08/12/2018   ID:  Heidi Carter, DOB 25-Apr-1946, MRN [id number]  PCP:  Heidi BrunetteSmith, Candace, MD   Chief Complaint  Patient presents with  . other    Former Heidi Carter c/o left underarm/right breat discomfort. Meds reviewed verbally with pt.    HPI:  Ms. Heidi Carter is a 72yo woman with PMHx of  HTN  seizures  Aortic valve regurgitation Who [facility], follow-up of her hypertension and aortic valve disease  Reports blood pressure at home typically in the 150 range over 70s Blood pressure elevated on last clinic visit with Dr. Tory Carter Told she needed to be on by bystolic.  She has been cutting the dose in half down to 2.5 mg daily Heart rate low at home, typically mid to low 50s Pulse rate 49 on today's visit  Occasionally with blurry vision in her eyes  Active, no regular exercise program  Review of prior echocardiogram from 2017 showing moderate aortic valve regurgitation normal ejection fraction Notes from Dr. Donnie Carter in December 2018 documenting mild to moderate AI  She reports that she takes all of her medications in the evening  EKG personally reviewed by myself on todays visit Shows sinus bradycardia rate 49 bpm nonspecific ST abnormality   PMH:   has a past medical history of Hyperlipidemia, Hypertension, Leaky heart valve, and Seizures (HCC).  PSH:    Past Surgical History:  Procedure Laterality Date  . ABDOMINAL HYSTERECTOMY      Current Outpatient Medications  Medication Sig Dispense Refill  . amLODipine (NORVASC) 10 MG tablet Take 10 mg by mouth daily.    Marland Kitchen. aspirin EC 81 MG tablet Take 81 mg by mouth every morning.    . carbamazepine (TEGRETOL) 200 MG tablet Take 200 mg by mouth daily.    . cetirizine (ZYRTEC) 10 MG tablet Take 10 mg by mouth daily.    . Cholecalciferol (VITAMIN D PO) Take 1 capsule by mouth daily.    . ferrous fumarate (HEMOCYTE - 106 MG FE) 325 (106 FE) MG TABS tablet Take 1  tablet by mouth every morning.    . nebivolol (BYSTOLIC) 5 MG tablet Take 5 mg by mouth daily.    . rosuvastatin (CRESTOR) 10 MG tablet Take 10 mg by mouth daily.    Marland Kitchen. spironolactone (ALDACTONE) 25 MG tablet Take 25 mg by mouth daily.    Marland Kitchen. telmisartan (MICARDIS) 80 MG tablet Take 80 mg by mouth daily.    Marland Kitchen. doxazosin (CARDURA) 2 MG tablet Take 1 tablet (2 mg total) by mouth daily. 30 tablet 11   No current facility-administered medications for this visit.      Allergies:   Patient has no known allergies.   Social History:  The patient  reports that she has never smoked. She has never used smokeless tobacco. She reports that she does not drink alcohol or use drugs.   Family History:   family history includes Clotting disorder in her sister.    Review of Systems: Review of Systems  Constitutional: Negative.   Respiratory: Negative.   Cardiovascular: Negative.   Gastrointestinal: Negative.   Musculoskeletal: Negative.   Neurological: Negative.   Psychiatric/Behavioral: Negative.   All other systems reviewed and are negative.    PHYSICAL EXAM: VS:  BP (!) 164/80 (BP Location: Right Arm, Patient Position: Sitting, Cuff Size: Normal)   Pulse (!) 49   Ht 5\' 1"  (1.549 m)   Wt 126 lb 4 oz (  57.3 kg)   BMI 23.85 kg/m  , BMI Body mass index is 23.85 kg/m. GEN: Well nourished, well developed, in no acute distress  HEENT: normal  Neck: no JVD, carotid bruits, or masses Cardiac: RRR; 1-2 diastolic murmur rsb, no  rubs, or gallops,no edema  Respiratory:  clear to auscultation bilaterally, normal work of breathing GI: soft, nontender, nondistended, + BS MS: no deformity or atrophy  Skin: warm and dry, no rash Neuro:  Strength and sensation are intact Psych: euthymic mood, full affect   Recent Labs: No results found for requested labs within last 8760 hours.    Lipid Panel No results found for: CHOL, HDL, LDLCALC, TRIG    Wt Readings from Last 3 Encounters:  08/12/18 126 lb 4  oz (57.3 kg)       ASSESSMENT AND PLAN:  Benign essential HTN - Lots of options for her blood pressure control Recommend she hold the beta-blocker given her bradycardia Other medication options given systolic pressure continues to run 150s includes Cardura, hydralazine, HCTZ Will avoid clonidine given bradycardia, try to avoid multiple pills with hydralazine, try to avoid 2 diuretics with HCTZ We did discuss in the future changing her Micardis to generic losartan given her high co-pay.  Also discussed possibly using a triple combination pill amlodipine/HCTZ/valsartan or other triple combo.  Triple combination pill was not started  Nonrheumatic aortic valve insufficiency - Echocardiogram ordered Prior echocardiogram 2017 with moderate aortic valve regurgitation She is asymptomatic, very mild murmur on exam Discussed valve pathology  Sinus bradycardia Recommend he stop the beta-blocker, bystolic, heart rate of 49 bpm Denies having any tachycardia or other arrhythmia  Disposition:   She will call us with blood pressure measurements We will also call her with the results of her echocardiogram  Long discussion concerning blood pressure pills, timing of her pills, various treatment options, types of medications available Discussed bradycardia, role of beta-blockers Discussed valve disease, previous records discussed with her Additional records requested  Total encounter time more than 45 minutes  Greater than 50% was spent in counseling and coordination of care with the patient     Orders Placed This Encounter  Procedures  . EKG 12-Lead  . ECHOCARDIOGRAM COMPLETE     Signed, Heidi Carter, M.D., Ph.D. 08/12/2018  [facility] [place], [place] [phone]

## 2018-08-12 ENCOUNTER — Encounter: Payer: Self-pay | Admitting: Cardiovascular Disease

## 2018-08-12 ENCOUNTER — Ambulatory Visit: Payer: Medicare HMO | Admitting: Cardiovascular Disease

## 2018-08-12 VITALS — BP 164/80 | HR 49 | Ht 61.0 in | Wt 126.2 lb

## 2018-08-12 DIAGNOSIS — I351 Nonrheumatic aortic (valve) insufficiency: Secondary | ICD-10-CM | POA: Diagnosis not present

## 2018-08-12 DIAGNOSIS — I10 Essential (primary) hypertension: Secondary | ICD-10-CM

## 2018-08-12 MED ORDER — DOXAZOSIN MESYLATE 2 MG PO TABS: 2.0000 mg | ORAL_TABLET | Freq: Every day | ORAL | 11 refills | Status: DC

## 2018-08-12 NOTE — Patient Instructions (Addendum)
Medication Instructions:   Please stop the bystolic, Heart rate is slow  Please call when you are running out of micardis, We can change back to valsartan or losartan  Please start cardura/doxazosin 2 mg once a day in the PM amlodpine in the Am Spironolactone in the Am Micardis in the PM   If you need a refill on your cardiac medications before your next appointment, please call your pharmacy.   Lab work: No new labs needed   If you have labs (blood work) drawn today and your tests are completely normal, you will receive your results only by: Marland Kitchen. MyChart Message (if you have MyChart) OR . A paper copy in the mail If you have any lab test that is abnormal or we need to change your treatment, we will call you to review the results.   Testing/Procedures: We will order an echocardiogram for aortic valve regurgitation   Follow-Up: At [facility], you and your health needs are our priority.  As part of our continuing mission to provide you with exceptional heart care, we have created designated Provider Care Teams.  These Care Teams include your primary Cardiologist (physician) and Advanced Practice Providers (APPs -  Physician Assistants and Nurse Practitioners) who all work together to provide you with the care you need, when you need it.  . You will need a follow up appointment in 12 months .   Please call our office 2 months in advance to schedule this appointment.    . Providers on your designated Care Team:   . Nicolasa Duckinghristopher Berge, NP . Eula Listenyan Dunn, PA-C . Marisue IvanJacquelyn Visser, PA-C  Any Other Special Instructions Will Be Listed Below (If Applicable).  For educational health videos Log in to : [email or web address] Or : [email or web address], password : [email or web address]

## 2018-08-19 ENCOUNTER — Ambulatory Visit (INDEPENDENT_AMBULATORY_CARE_PROVIDER_SITE_OTHER): Payer: Medicare HMO

## 2018-08-19 ENCOUNTER — Other Ambulatory Visit: Payer: Self-pay

## 2018-08-19 DIAGNOSIS — I351 Nonrheumatic aortic (valve) insufficiency: Secondary | ICD-10-CM

## 2018-09-16 ENCOUNTER — Telehealth: Payer: Self-pay | Admitting: Cardiology

## 2018-09-16 ENCOUNTER — Other Ambulatory Visit: Payer: Self-pay | Admitting: Cardiovascular Disease

## 2018-09-16 NOTE — Telephone Encounter (Signed)
Please Print last OV  

## 2018-09-16 NOTE — Telephone Encounter (Signed)
° °  1. Which medications need to be refilled? (please list name of each medication and dose if known) Spironolactone 25mg  tablet once daily  2. Which pharmacy/location (including street and city if local pharmacy) is medication to be sent to?[id number]  3. Do they need a 30 day or 90 day supply? 30

## 2018-09-18 ENCOUNTER — Other Ambulatory Visit: Payer: Self-pay | Admitting: Family Medicine

## 2018-09-18 DIAGNOSIS — Z1231 Encounter for screening mammogram for malignant neoplasm of breast: Secondary | ICD-10-CM

## 2018-09-25 DIAGNOSIS — I10 Essential (primary) hypertension: Secondary | ICD-10-CM | POA: Diagnosis not present

## 2018-09-25 DIAGNOSIS — K219 Gastro-esophageal reflux disease without esophagitis: Secondary | ICD-10-CM | POA: Diagnosis not present

## 2018-09-25 DIAGNOSIS — Z1389 Encounter for screening for other disorder: Secondary | ICD-10-CM | POA: Diagnosis not present

## 2018-09-25 DIAGNOSIS — Z0000 Encounter for general adult medical examination without abnormal findings: Secondary | ICD-10-CM | POA: Diagnosis not present

## 2018-09-25 DIAGNOSIS — E782 Mixed hyperlipidemia: Secondary | ICD-10-CM | POA: Diagnosis not present

## 2018-09-25 DIAGNOSIS — R7303 Prediabetes: Secondary | ICD-10-CM | POA: Diagnosis not present

## 2018-10-17 ENCOUNTER — Ambulatory Visit
Admission: RE | Admit: 2018-10-17 | Discharge: 2018-10-17 | Disposition: A | Discharge status: Home / Self Care | Payer: Medicare HMO | Source: Ambulatory Visit | Attending: Family Medicine | Admitting: Family Medicine

## 2018-10-17 DIAGNOSIS — Z1231 Encounter for screening mammogram for malignant neoplasm of breast: Secondary | ICD-10-CM

## 2018-12-13 DIAGNOSIS — M79605 Pain in left leg: Secondary | ICD-10-CM | POA: Diagnosis not present

## 2018-12-17 ENCOUNTER — Other Ambulatory Visit: Payer: Self-pay

## 2018-12-17 MED ORDER — DOXAZOSIN MESYLATE 2 MG PO TABS: 2.0000 mg | ORAL_TABLET | Freq: Every day | ORAL | 0 refills | Status: DC

## 2019-03-26 DIAGNOSIS — G40909 Epilepsy, unspecified, not intractable, without status epilepticus: Secondary | ICD-10-CM | POA: Diagnosis not present

## 2019-03-26 DIAGNOSIS — I10 Essential (primary) hypertension: Secondary | ICD-10-CM | POA: Diagnosis not present

## 2019-03-26 DIAGNOSIS — K219 Gastro-esophageal reflux disease without esophagitis: Secondary | ICD-10-CM | POA: Diagnosis not present

## 2019-03-26 DIAGNOSIS — R7303 Prediabetes: Secondary | ICD-10-CM | POA: Diagnosis not present

## 2019-03-26 DIAGNOSIS — E782 Mixed hyperlipidemia: Secondary | ICD-10-CM | POA: Diagnosis not present

## 2019-04-21 DIAGNOSIS — R7303 Prediabetes: Secondary | ICD-10-CM | POA: Diagnosis not present

## 2019-04-21 DIAGNOSIS — I10 Essential (primary) hypertension: Secondary | ICD-10-CM | POA: Diagnosis not present

## 2019-04-21 DIAGNOSIS — G40909 Epilepsy, unspecified, not intractable, without status epilepticus: Secondary | ICD-10-CM | POA: Diagnosis not present

## 2019-04-22 DIAGNOSIS — E782 Mixed hyperlipidemia: Secondary | ICD-10-CM | POA: Diagnosis not present

## 2019-04-22 DIAGNOSIS — I10 Essential (primary) hypertension: Secondary | ICD-10-CM | POA: Diagnosis not present

## 2019-04-22 DIAGNOSIS — E7800 Pure hypercholesterolemia, unspecified: Secondary | ICD-10-CM | POA: Diagnosis not present

## 2019-04-22 DIAGNOSIS — E785 Hyperlipidemia, unspecified: Secondary | ICD-10-CM | POA: Diagnosis not present

## 2019-04-22 DIAGNOSIS — F329 Major depressive disorder, single episode, unspecified: Secondary | ICD-10-CM | POA: Diagnosis not present

## 2019-05-13 DIAGNOSIS — F329 Major depressive disorder, single episode, unspecified: Secondary | ICD-10-CM | POA: Diagnosis not present

## 2019-05-13 DIAGNOSIS — E782 Mixed hyperlipidemia: Secondary | ICD-10-CM | POA: Diagnosis not present

## 2019-05-13 DIAGNOSIS — I10 Essential (primary) hypertension: Secondary | ICD-10-CM | POA: Diagnosis not present

## 2019-05-13 DIAGNOSIS — E7800 Pure hypercholesterolemia, unspecified: Secondary | ICD-10-CM | POA: Diagnosis not present

## 2019-05-13 DIAGNOSIS — E785 Hyperlipidemia, unspecified: Secondary | ICD-10-CM | POA: Diagnosis not present

## 2019-06-27 DIAGNOSIS — M5432 Sciatica, left side: Secondary | ICD-10-CM | POA: Diagnosis not present

## 2019-07-04 DIAGNOSIS — E7800 Pure hypercholesterolemia, unspecified: Secondary | ICD-10-CM | POA: Diagnosis not present

## 2019-07-04 DIAGNOSIS — I10 Essential (primary) hypertension: Secondary | ICD-10-CM | POA: Diagnosis not present

## 2019-07-04 DIAGNOSIS — E782 Mixed hyperlipidemia: Secondary | ICD-10-CM | POA: Diagnosis not present

## 2019-07-04 DIAGNOSIS — E785 Hyperlipidemia, unspecified: Secondary | ICD-10-CM | POA: Diagnosis not present

## 2019-07-04 DIAGNOSIS — F329 Major depressive disorder, single episode, unspecified: Secondary | ICD-10-CM | POA: Diagnosis not present

## 2019-08-11 NOTE — Progress Notes (Signed)
Cardiology Office Note  Date:  08/12/2019   ID:  Heidi Carter, DOB 19-Feb-1946, MRN [id number]  PCP:  Heidi Brunette, MD   Chief Complaint  Patient presents with  . Other    12 month follow up. patient c/o some off and on chest pain and SOB. Meds reviewed verbally with patient.     HPI:  Heidi Carter is a 73 yo woman with PMHx of  HTN  seizures  2017 showing moderate aortic valve regurgitation  2018 with mild to moderate AI Who presents for follow-up of her hypertension and aortic valve disease  Recent tragic loss of her daughter in early 2020 Because unclear  Otherwise reports she has been doing relatively well BP at home, 126/70, range up to 130  typically runs around that range Off bystolic, held last office visit for bradycardia Reports that she was given a prescription for spironolactone, has not started yet  Active with no regular exercise program  EKG personally reviewed by myself on todays visit Shows sinus bradycardia rate 57 bpm nonspecific ST abnormality, no change from EKG dating back to 2016  Review of prior echocardiogram from 2017 showing moderate aortic valve regurgitation normal ejection fraction Notes from Dr. Donnie Aho in December 2018 documenting mild to moderate AI   PMH:   has a past medical history of Hyperlipidemia, Hypertension, Leaky heart valve, and Seizures (HCC).  PSH:    Past Surgical History:  Procedure Laterality Date  . ABDOMINAL HYSTERECTOMY      Current Outpatient Medications  Medication Sig Dispense Refill  . amLODipine (NORVASC) 10 MG tablet Take 10 mg by mouth daily.    Marland Kitchen aspirin EC 81 MG tablet Take 81 mg by mouth every morning.    . carbamazepine (TEGRETOL) 200 MG tablet Take 200 mg by mouth daily.    . cetirizine (ZYRTEC) 10 MG tablet Take 10 mg by mouth daily.    . Cholecalciferol (VITAMIN D PO) Take 1 capsule by mouth daily.    Marland Kitchen doxazosin (CARDURA) 2 MG tablet Take 1 tablet (2 mg total) by mouth daily. 90 tablet 0  .  ferrous fumarate (HEMOCYTE - 106 MG FE) 325 (106 FE) MG TABS tablet Take 1 tablet by mouth every morning.    . rosuvastatin (CRESTOR) 10 MG tablet Take 10 mg by mouth daily.    Marland Kitchen telmisartan (MICARDIS) 80 MG tablet Take 80 mg by mouth daily.    Marland Kitchen spironolactone (ALDACTONE) 25 MG tablet TAKE 1 TABLET BY MOUTH EVERY DAY (Patient not taking: Reported on 08/12/2019) 90 tablet 3   No current facility-administered medications for this visit.     Allergies:   Patient has no known allergies.   Social History:  The patient  reports that she has never smoked. She has never used smokeless tobacco. She reports that she does not drink alcohol or use drugs.   Family History:   family history includes Clotting disorder in her sister.    Review of Systems: Review of Systems  Constitutional: Negative.   HENT: Negative.   Respiratory: Negative.   Cardiovascular: Negative.   Gastrointestinal: Negative.   Musculoskeletal: Negative.   Neurological: Negative.   Psychiatric/Behavioral: Negative.   All other systems reviewed and are negative.   PHYSICAL EXAM: VS:  BP (!) 150/62 (BP Location: Left Arm, Patient Position: Sitting, Cuff Size: Normal)   Pulse (!) 57   Ht 5' (1.524 m)   Wt 124 lb 4 oz (56.4 kg)   SpO2 99%   BMI 24.27  kg/m  , BMI Body mass index is 24.27 kg/m. Constitutional:  oriented to person, place, and time. No distress.  HENT:  Head: Grossly normal Eyes:  no discharge. No scleral icterus.  Neck: No JVD, no carotid bruits  Cardiovascular: Regular rate and rhythm, no murmurs appreciated Pulmonary/Chest: Clear to auscultation bilaterally, no wheezes or rails Abdominal: Soft.  no distension.  no tenderness.  Musculoskeletal: Normal range of motion Neurological:  normal muscle tone. Coordination normal. No atrophy Skin: Skin warm and dry Psychiatric: normal affect, pleasant  Recent Labs: No results found for requested labs within last 8760 hours.    Lipid Panel No results  found for: CHOL, HDL, LDLCALC, TRIG    Wt Readings from Last 3 Encounters:  08/12/19 124 lb 4 oz (56.4 kg)  08/12/18 126 lb 4 oz (57.3 kg)     ASSESSMENT AND PLAN:  Benign essential HTN - Blood pressure elevated in the office, reports anxiety, long walk in the office Typically well controlled at home, by her report 671 up to 245 systolic She has not started spironolactone  Recommended she monitor blood pressure before starting spironolactone, would hold the medication if blood pressure runs low If numbers do run as she detailed above, may not need spironolactone  Nonrheumatic aortic valve insufficiency - Reports indicating was moderate in 2017, outside study Mild to moderate 2018, outside study Mild in December 2019, available through our system for review No appreciable murmur on today's exam, no further testing needed Feels well without significant shortness of breath  Sinus bradycardia bystolic stopped last visit, rate better   Total encounter time more than 25 minutes  Greater than 50% was spent in counseling and coordination of care with the patient  Follow-up 12 months as needed  Orders Placed This Encounter  Procedures  . EKG 12-Lead     Signed, Esmond Plants, M.D., Ph.D. 08/12/2019  [place], [place]

## 2019-08-12 ENCOUNTER — Encounter: Payer: Self-pay | Admitting: Cardiovascular Disease

## 2019-08-12 ENCOUNTER — Encounter (INDEPENDENT_AMBULATORY_CARE_PROVIDER_SITE_OTHER): Payer: Self-pay

## 2019-08-12 ENCOUNTER — Other Ambulatory Visit: Payer: Self-pay

## 2019-08-12 ENCOUNTER — Ambulatory Visit (INDEPENDENT_AMBULATORY_CARE_PROVIDER_SITE_OTHER): Payer: Medicare HMO | Admitting: Cardiovascular Disease

## 2019-08-12 VITALS — BP 150/62 | HR 57 | Ht 60.0 in | Wt 124.2 lb

## 2019-08-12 DIAGNOSIS — I10 Essential (primary) hypertension: Secondary | ICD-10-CM

## 2019-08-12 DIAGNOSIS — I351 Nonrheumatic aortic (valve) insufficiency: Secondary | ICD-10-CM | POA: Diagnosis not present

## 2019-08-12 NOTE — Patient Instructions (Addendum)
We will request labs from PMD, lipids  Medication Instructions:  No changes  If you need a refill on your cardiac medications before your next appointment, please call your pharmacy.    Lab work: No new labs needed   If you have labs (blood work) drawn today and your tests are completely normal, you will receive your results only by: Marland Kitchen MyChart Message (if you have MyChart) OR . A paper copy in the mail If you have any lab test that is abnormal or we need to change your treatment, we will call you to review the results.   Testing/Procedures: No new testing needed   Follow-Up: At [facility], you and your health needs are our priority.  As part of our continuing mission to provide you with exceptional heart care, we have created designated Provider Care Teams.  These Care Teams include your primary Cardiologist (physician) and Advanced Practice Providers (APPs -  Physician Assistants and Nurse Practitioners) who all work together to provide you with the care you need, when you need it.  . You will need a follow up appointment in 12 months   . Providers on your designated Care Team:   . Murray Hodgkins, NP . Christell Faith, PA-C . Marrianne Mood, PA-C  Any Other Special Instructions Will Be Listed Below (If Applicable).  For educational health videos Log in to : [email or web address] Or : [email or web address], password : [email or web address]

## 2019-08-26 ENCOUNTER — Other Ambulatory Visit: Payer: Self-pay | Admitting: Family Medicine

## 2019-08-26 DIAGNOSIS — E782 Mixed hyperlipidemia: Secondary | ICD-10-CM | POA: Diagnosis not present

## 2019-08-26 DIAGNOSIS — I10 Essential (primary) hypertension: Secondary | ICD-10-CM | POA: Diagnosis not present

## 2019-08-26 DIAGNOSIS — F329 Major depressive disorder, single episode, unspecified: Secondary | ICD-10-CM | POA: Diagnosis not present

## 2019-08-26 DIAGNOSIS — E785 Hyperlipidemia, unspecified: Secondary | ICD-10-CM | POA: Diagnosis not present

## 2019-08-26 DIAGNOSIS — E7800 Pure hypercholesterolemia, unspecified: Secondary | ICD-10-CM | POA: Diagnosis not present

## 2019-08-26 DIAGNOSIS — Z1231 Encounter for screening mammogram for malignant neoplasm of breast: Secondary | ICD-10-CM

## 2019-09-09 DIAGNOSIS — E782 Mixed hyperlipidemia: Secondary | ICD-10-CM | POA: Diagnosis not present

## 2019-09-09 DIAGNOSIS — I10 Essential (primary) hypertension: Secondary | ICD-10-CM | POA: Diagnosis not present

## 2019-09-09 DIAGNOSIS — E785 Hyperlipidemia, unspecified: Secondary | ICD-10-CM | POA: Diagnosis not present

## 2019-09-09 DIAGNOSIS — F329 Major depressive disorder, single episode, unspecified: Secondary | ICD-10-CM | POA: Diagnosis not present

## 2019-09-09 DIAGNOSIS — E7800 Pure hypercholesterolemia, unspecified: Secondary | ICD-10-CM | POA: Diagnosis not present

## 2019-10-15 DIAGNOSIS — K219 Gastro-esophageal reflux disease without esophagitis: Secondary | ICD-10-CM | POA: Diagnosis not present

## 2019-10-15 DIAGNOSIS — Z1389 Encounter for screening for other disorder: Secondary | ICD-10-CM | POA: Diagnosis not present

## 2019-10-15 DIAGNOSIS — I10 Essential (primary) hypertension: Secondary | ICD-10-CM | POA: Diagnosis not present

## 2019-10-15 DIAGNOSIS — G40909 Epilepsy, unspecified, not intractable, without status epilepticus: Secondary | ICD-10-CM | POA: Diagnosis not present

## 2019-10-15 DIAGNOSIS — Z0000 Encounter for general adult medical examination without abnormal findings: Secondary | ICD-10-CM | POA: Diagnosis not present

## 2019-10-15 DIAGNOSIS — E782 Mixed hyperlipidemia: Secondary | ICD-10-CM | POA: Diagnosis not present

## 2019-10-15 DIAGNOSIS — R7303 Prediabetes: Secondary | ICD-10-CM | POA: Diagnosis not present

## 2019-10-20 ENCOUNTER — Ambulatory Visit
Admission: RE | Admit: 2019-10-20 | Discharge: 2019-10-20 | Disposition: A | Discharge status: Home / Self Care | Payer: Medicare HMO | Source: Ambulatory Visit | Attending: Family Medicine | Admitting: Family Medicine

## 2019-10-20 ENCOUNTER — Other Ambulatory Visit: Payer: Self-pay

## 2019-10-20 DIAGNOSIS — Z1231 Encounter for screening mammogram for malignant neoplasm of breast: Secondary | ICD-10-CM | POA: Diagnosis not present

## 2019-10-21 DIAGNOSIS — E7800 Pure hypercholesterolemia, unspecified: Secondary | ICD-10-CM | POA: Diagnosis not present

## 2019-10-21 DIAGNOSIS — E782 Mixed hyperlipidemia: Secondary | ICD-10-CM | POA: Diagnosis not present

## 2019-10-21 DIAGNOSIS — F329 Major depressive disorder, single episode, unspecified: Secondary | ICD-10-CM | POA: Diagnosis not present

## 2019-10-21 DIAGNOSIS — E785 Hyperlipidemia, unspecified: Secondary | ICD-10-CM | POA: Diagnosis not present

## 2019-10-21 DIAGNOSIS — I10 Essential (primary) hypertension: Secondary | ICD-10-CM | POA: Diagnosis not present

## 2019-12-17 DIAGNOSIS — E785 Hyperlipidemia, unspecified: Secondary | ICD-10-CM | POA: Diagnosis not present

## 2019-12-17 DIAGNOSIS — I10 Essential (primary) hypertension: Secondary | ICD-10-CM | POA: Diagnosis not present

## 2019-12-17 DIAGNOSIS — F329 Major depressive disorder, single episode, unspecified: Secondary | ICD-10-CM | POA: Diagnosis not present

## 2019-12-17 DIAGNOSIS — E7800 Pure hypercholesterolemia, unspecified: Secondary | ICD-10-CM | POA: Diagnosis not present

## 2019-12-17 DIAGNOSIS — E782 Mixed hyperlipidemia: Secondary | ICD-10-CM | POA: Diagnosis not present

## 2020-01-05 DIAGNOSIS — E782 Mixed hyperlipidemia: Secondary | ICD-10-CM | POA: Diagnosis not present

## 2020-01-05 DIAGNOSIS — E7800 Pure hypercholesterolemia, unspecified: Secondary | ICD-10-CM | POA: Diagnosis not present

## 2020-01-05 DIAGNOSIS — E785 Hyperlipidemia, unspecified: Secondary | ICD-10-CM | POA: Diagnosis not present

## 2020-01-05 DIAGNOSIS — F329 Major depressive disorder, single episode, unspecified: Secondary | ICD-10-CM | POA: Diagnosis not present

## 2020-01-05 DIAGNOSIS — I10 Essential (primary) hypertension: Secondary | ICD-10-CM | POA: Diagnosis not present

## 2020-01-28 ENCOUNTER — Other Ambulatory Visit: Payer: Self-pay

## 2020-01-28 ENCOUNTER — Ambulatory Visit: Payer: Medicare HMO | Admitting: Family

## 2020-01-28 ENCOUNTER — Encounter: Payer: Self-pay | Admitting: Family

## 2020-01-28 VITALS — BP 100/68 | HR 58 | Ht 61.0 in | Wt 124.5 lb

## 2020-01-28 DIAGNOSIS — I10 Essential (primary) hypertension: Secondary | ICD-10-CM

## 2020-01-28 DIAGNOSIS — R002 Palpitations: Secondary | ICD-10-CM

## 2020-01-28 DIAGNOSIS — I351 Nonrheumatic aortic (valve) insufficiency: Secondary | ICD-10-CM | POA: Diagnosis not present

## 2020-01-28 DIAGNOSIS — R079 Chest pain, unspecified: Secondary | ICD-10-CM | POA: Diagnosis not present

## 2020-01-28 DIAGNOSIS — E782 Mixed hyperlipidemia: Secondary | ICD-10-CM

## 2020-01-28 DIAGNOSIS — R072 Precordial pain: Secondary | ICD-10-CM

## 2020-01-28 MED ORDER — METOPROLOL TARTRATE 100 MG PO TABS: ORAL_TABLET | ORAL | 0 refills | Status: DC

## 2020-01-28 NOTE — Patient Instructions (Addendum)
Medication Instructions:   1- STOP Spironolactone   *If you need a refill on your cardiac medications before your next appointment, please call your pharmacy*   Lab Work: 1- Your physician recommends that you return for lab work in: 1 week prior to CT at the medical mall.  No appt is needed. Hours are M-F 7AM- 6 PM.   Testing/Procedures:  Your physician has requested that you have cardiac CT. Cardiac computed tomography (CT) is a painless test that uses an x-ray machine to take clear, detailed pictures of your heart. For further information please visit HugeFiesta.tn. Please follow instruction sheet as given. Your cardiac CT will be scheduled at one of the below locations:   [facility] [address] [place], [place] [address] [phone]  [place] [address] [place], [place] [address] [phone]  If scheduled at [facility], please arrive at the [facility] main entrance of [facility] 30 minutes prior to test start time. Proceed to the [facility] Radiology Department (first floor) to check-in and test prep.  If scheduled at [facility], please arrive 15 mins early for check-in and test prep.  Please follow these instructions carefully (unless otherwise directed):   On the Night Before the Test: . Be sure to Drink plenty of water. . Do not consume any caffeinated/decaffeinated beverages or chocolate 12 hours prior to your test. . Do not take any antihistamines 12 hours prior to your test.  On the Day of the Test: Please have someone drive you to and from imaging. . Drink plenty of water. Do not drink any water within one hour of the test. . Do not eat any food 4 hours prior to the test. . You may take your regular medications prior to the test.  . Take metoprolol (Lopressor) two hours prior to test. . FEMALES- please wear underwire-free bra if  available       After the Test: . Drink plenty of water. . After receiving IV contrast, you may experience a mild flushed feeling. This is normal. . On occasion, you may experience a mild rash up to 24 hours after the test. This is not dangerous. If this occurs, you can take Benadryl 25 mg and increase your fluid intake. . If you experience trouble breathing, this can be serious. If it is severe call 911 IMMEDIATELY. If it is mild, please call our office. . If you take any of these medications: Glipizide/Metformin, Avandament, Glucavance, please do not take 48 hours after completing test unless otherwise instructed.   Once we have confirmed authorization from your insurance company, we will call you to set up a date and time for your test.   For non-scheduling related questions, please contact the cardiac imaging nurse navigator should you have any questions/concerns: Marchia Bond, Cardiac Imaging Nurse Navigator Burley Saver, Interim Cardiac Imaging Nurse [facility] Direct Office Dial: [phone]   For scheduling needs, including cancellations and rescheduling, please call [phone].     Follow-Up: At [facility], you and your health needs are our priority.  As part of our continuing mission to provide you with exceptional heart care, we have created designated Provider Care Teams.  These Care Teams include your primary Cardiologist (physician) and Advanced Practice Providers (APPs -  Physician Assistants and Nurse Practitioners) who all work together to provide you with the care you need, when you need it.  We  recommend signing up for the patient portal called "MyChart".  Sign up information is provided on this After Visit Summary.  MyChart is used to connect with patients for Virtual Visits (Telemedicine).  Patients are able to view lab/test results, encounter notes, upcoming appointments, etc.  Non-urgent messages can be sent to your provider  as well.   To learn more about what you can do with MyChart, go to [email or web address].    Your next appointment:  6-8 weeks by MD or app  Other Instructions  If your blood pressure is consistently less than 110/60, please let us know. Would recommend bringing your blood pressure cuff to next office.    Palpitations Palpitations are feelings that your heartbeat is not normal. Your heartbeat may feel like it is:  Uneven.  Faster than normal.  Fluttering.  Skipping a beat. This is usually not a serious problem. In some cases, you may need tests to rule out any serious problems. Follow these instructions at home: Pay attention to any changes in your condition. Take these actions to help manage your symptoms: Eating and drinking  Avoid: ? Coffee, tea, soft drinks, and energy drinks. ? Chocolate. ? Alcohol. ? Diet pills. Lifestyle   Try to lower your stress. These things can help you relax: ? Yoga. ? Deep breathing and meditation. ? Exercise. ? Using words and images to create positive thoughts (guided imagery). ? Using your mind to control things in your body (biofeedback).  Do not use drugs.  Get plenty of rest and sleep. Keep a regular bed time. General instructions   Take over-the-counter and prescription medicines only as told by your doctor.  Do not use any products that contain nicotine or tobacco, such as cigarettes and e-cigarettes. If you need help quitting, ask your doctor.  Keep all follow-up visits as told by your doctor. This is important. You may need more tests if palpitations do not go away or get worse. Contact a doctor if:  Your symptoms last more than 24 hours.  Your symptoms occur more often. Get help right away if you:  Have chest pain.  Feel short of breath.  Have a very bad headache.  Feel dizzy.  Pass out (faint). Summary  Palpitations are feelings that your heartbeat is uneven or faster than normal. It may feel like your  heart is fluttering or skipping a beat.  Avoid food and drinks that may cause palpitations. These include caffeine, chocolate, and alcohol.  Try to lower your stress. Do not smoke or use drugs.  Get help right away if you faint or have chest pain, shortness of breath, a severe headache, or dizziness. This information is not intended to replace advice given to you by your health care provider. Make sure you discuss any questions you have with your health care provider. Document Revised: 09/26/2017 Document Reviewed: 09/26/2017 Elsevier Patient Education  2020 [employer].

## 2020-01-28 NOTE — Progress Notes (Signed)
Office Visit    Patient Name: Heidi Carter Date of Encounter: 01/28/2020  Primary Care Provider:  Carol Ada, MD Primary Cardiologist:  Ida Rogue, MD Electrophysiologist:  None   Chief Complaint    Heidi Carter is a 74 y.o. female with a hx of HTN, nonrheumatic aortic valve insufficiency, sinus bradycardia, seizures presents today for palpitations  Past Medical History    Past Medical History:  Diagnosis Date  . Hyperlipidemia   . Hypertension   . Leaky heart valve   . Seizures ([facility])    Past Surgical History:  Procedure Laterality Date  . ABDOMINAL HYSTERECTOMY      Allergies  No Known Allergies  History of Present Illness    Heidi Carter is a 74 y.o. female with a hx of HTN, nonrheumatic aortic valve insufficiency, sinus bradycardia, seizures, HLD.  She was last seen 08/12/2019 by Dr. Rockey Situ.  Previous echocardiograms in including 2017 with moderate AI.  2018 with mild to moderate AI.  Echo 07/2018 LVEF 55 to 60%, normal wall motion, grade 1 diastolic dysfunction, mild AI, and dilated dilated LA.  Her Bystolic was previously been discontinued due to bradycardia.  Reports intermittent racing heart beats. No noted increased stressors or excessive caffeine intake.   Intermittent chest tightness over the last three days both at rest and with activity. Occurs under her left axilla and left chest. No radiation to neck, but does radiate to arm. Sometimes feels pressure and achey. Lasts about 5-10 minutes.   BP at home 134/71 HR 65. Uses an arm cuff at home.   Reports weakness in her legs. Last week walked 2-4 miles. This week has had sensation of 'weakness' hit her. Concern for some dehydration with low normal BP on exam today.   Tells me her sister passed away from "blood clots in her heart". Thinks her dad passed away from the same thing.   EKGs/Labs/Other Studies Reviewed:   The following studies were reviewed today:  Echo 07/2018 - Left  ventricle: The cavity size was normal. There was mild    concentric hypertrophy. Systolic function was normal. The    estimated ejection fraction was in the range of 55% to 60%. Wall    motion was normal; there were no regional wall motion    abnormalities. Doppler parameters are consistent with abnormal    left ventricular relaxation (grade 1 diastolic dysfunction).  - Aortic valve: There was mild regurgitation.  - Left atrium: The atrium was mildly dilated.  - Pulmonary arteries: Systolic pressure could not be accurately    estimated.   EKG:  EKG is ordered today.  The ekg ordered today demonstrates SB 58 bpm with nonspecific St/T wave abnormality which is stable compared to previous and no acute ST/T wave changes.   Recent Labs: No results found for requested labs within last 8760 hours.  Recent Lipid Panel No results found for: CHOL, TRIG, HDL, CHOLHDL, VLDL, LDLCALC, LDLDIRECT  Home Medications   Current Meds  Medication Sig  . amLODipine (NORVASC) 10 MG tablet Take 10 mg by mouth daily.  Marland Kitchen aspirin EC 81 MG tablet Take 81 mg by mouth every morning.  . carbamazepine (TEGRETOL) 200 MG tablet Take 200 mg by mouth daily.  . cetirizine (ZYRTEC) 10 MG tablet Take 10 mg by mouth daily.  . Cholecalciferol (VITAMIN D PO) Take 1 capsule by mouth daily.  Marland Kitchen doxazosin (CARDURA) 2 MG tablet Take 1 tablet (2 mg total) by mouth daily.  Marland Kitchen  ferrous fumarate (HEMOCYTE - 106 MG FE) 325 (106 FE) MG TABS tablet Take 1 tablet by mouth every morning.  . pantoprazole (PROTONIX) 40 MG tablet Take 40 mg by mouth daily.   . rosuvastatin (CRESTOR) 10 MG tablet Take 10 mg by mouth daily.  Marland Kitchen telmisartan (MICARDIS) 80 MG tablet Take 80 mg by mouth daily.  . [DISCONTINUED] spironolactone (ALDACTONE) 25 MG tablet TAKE 1 TABLET BY MOUTH EVERY DAY      Review of Systems      Review of Systems  Constitution: Negative for chills, fever and malaise/fatigue.  Cardiovascular: Positive for chest pain and dyspnea  on exertion. Negative for leg swelling, near-syncope, orthopnea, palpitations and syncope.  Respiratory: Negative for cough, shortness of breath and wheezing.   Gastrointestinal: Negative for nausea and vomiting.  Neurological: Negative for dizziness, light-headedness and weakness.   All other systems reviewed and are otherwise negative except as noted above.  Physical Exam    VS:  BP 100/68 (BP Location: Left Arm, Patient Position: Sitting, Cuff Size: Normal)   Pulse (!) 58   Ht 5\' 1"  (1.549 m)   Wt 124 lb 8 oz (56.5 kg)   SpO2 98%   BMI 23.52 kg/m  , BMI Body mass index is 23.52 kg/m. GEN: Well nourished, well developed, in no acute distress. HEENT: normal. Neck: Supple, no JVD, carotid bruits, or masses. Cardiac: RRR, no murmurs, rubs, or gallops. No clubbing, cyanosis, edema.  Radials/DP/PT 2+ and equal bilaterally.  Respiratory:  Respirations regular and unlabored, clear to auscultation bilaterally. GI: Soft, nontender, nondistended, BS + x 4. MS: No deformity or atrophy. Skin: Warm and dry, no rash. Neuro:  Strength and sensation are intact. Psych: Normal affect.  Assessment & Plan    1. Chest pain in adult - 3 day history of chest pain. Risk factors include age, HLD, family history of "blood clot in heart". Remote stress test many years ago. Typical in that it is left sided, atypical in that it occurs both at rest and with activity. EKG today without acute ST/T wave changes.  Plan for Cardiac CTA.  2. Palpitations -  Reports intermittent fast heart rates. Not associated with near syncope, pain, dyspnea. Discussed possibility of ZIo which she politely declines. Discussed avoidance of triggers for palpitations   3. HTN - BP well controlled, low normal. Due to some fatigue, stop Spironolactone. Continue Amlodipine 10mg  daily, Doxazosin 2mg  daily, Metoprolol 100mg  BID, Telmisartan 80mg  daily. Home XBP routinely 120s-130s. Encouraged to bring BP cuff to next visit to ensure  accuracy.   4. Mild aortic insufficiency by echo in 07/2018 - Continue optimal BP control.   5. HLD - Continue Crestor 10mg  daily.   Disposition: Follow up in 6 week(s) with Dr. or APP   , NP 01/28/2020, 5:03 PM

## 2020-01-30 ENCOUNTER — Telehealth: Payer: Self-pay | Admitting: Cardiovascular Disease

## 2020-01-30 NOTE — Telephone Encounter (Signed)
Patient calling to clarify date for labs. She was going to go today. Patient now aware that avs says 1 week prior to ct and to wait until she is scheduled to plan pre procedure labs.    Patient aware if there is another need for labs clinical will call to update .

## 2020-01-30 NOTE — Telephone Encounter (Signed)
That is correct. Closing encounter.

## 2020-02-18 ENCOUNTER — Other Ambulatory Visit
Admission: RE | Admit: 2020-02-18 | Discharge: 2020-02-18 | Disposition: A | Discharge status: Home / Self Care | Payer: Medicare HMO | Attending: Family | Admitting: Family

## 2020-02-18 DIAGNOSIS — R079 Chest pain, unspecified: Secondary | ICD-10-CM

## 2020-02-18 LAB — BASIC METABOLIC PANEL
Anion gap: 8 (ref 5–15)
BUN: 8 mg/dL (ref 8–23)
CO2: 28 mmol/L (ref 22–32)
Calcium: 9.4 mg/dL (ref 8.9–10.3)
Chloride: 102 mmol/L (ref 98–111)
Creatinine, Ser: 0.65 mg/dL (ref 0.44–1.00)
GFR calc Af Amer: >60 mL/min (ref >60)
GFR calc non Af Amer: >60 mL/min (ref >60)
Glucose, Bld: 116 mg/dL — ABNORMAL HIGH (ref 70–99)
Potassium: 4.6 mmol/L (ref 3.5–5.1)
Sodium: 138 mmol/L (ref 135–145)

## 2020-02-23 ENCOUNTER — Ambulatory Visit (HOSPITAL_COMMUNITY): Payer: Medicare HMO

## 2020-02-27 ENCOUNTER — Telehealth (HOSPITAL_COMMUNITY): Payer: Self-pay | Admitting: *Deleted

## 2020-02-27 NOTE — Telephone Encounter (Signed)

## 2020-03-02 ENCOUNTER — Ambulatory Visit (HOSPITAL_COMMUNITY)
Admission: RE | Admit: 2020-03-02 | Discharge: 2020-03-02 | Disposition: A | Discharge status: Home / Self Care | Payer: Medicare HMO | Source: Ambulatory Visit | Attending: Family | Admitting: Family

## 2020-03-02 ENCOUNTER — Encounter (HOSPITAL_COMMUNITY): Payer: Self-pay

## 2020-03-02 ENCOUNTER — Other Ambulatory Visit: Payer: Self-pay

## 2020-03-02 DIAGNOSIS — R072 Precordial pain: Secondary | ICD-10-CM | POA: Diagnosis not present

## 2020-03-02 MED ORDER — NITROGLYCERIN 0.4 MG SL SUBL
SUBLINGUAL_TABLET | SUBLINGUAL | Status: AC
  Filled 2020-03-02: qty 2

## 2020-03-02 MED ORDER — NITROGLYCERIN 0.4 MG SL SUBL
0.8000 mg | SUBLINGUAL_TABLET | Freq: Once | SUBLINGUAL | Status: AC
  Administered 2020-03-02: 0.8 mg via SUBLINGUAL

## 2020-03-02 MED ORDER — IOHEXOL 350 MG/ML SOLN
80.0000 mL | Freq: Once | INTRAVENOUS | Status: AC | PRN
  Administered 2020-03-02: 80 mL via INTRAVENOUS

## 2020-03-18 DIAGNOSIS — I10 Essential (primary) hypertension: Secondary | ICD-10-CM | POA: Diagnosis not present

## 2020-03-18 DIAGNOSIS — F329 Major depressive disorder, single episode, unspecified: Secondary | ICD-10-CM | POA: Diagnosis not present

## 2020-03-18 DIAGNOSIS — E785 Hyperlipidemia, unspecified: Secondary | ICD-10-CM | POA: Diagnosis not present

## 2020-03-18 DIAGNOSIS — E7800 Pure hypercholesterolemia, unspecified: Secondary | ICD-10-CM | POA: Diagnosis not present

## 2020-03-18 DIAGNOSIS — E782 Mixed hyperlipidemia: Secondary | ICD-10-CM | POA: Diagnosis not present

## 2020-03-24 ENCOUNTER — Ambulatory Visit: Payer: Medicare HMO | Admitting: Cardiovascular Disease

## 2020-04-05 DIAGNOSIS — H52209 Unspecified astigmatism, unspecified eye: Secondary | ICD-10-CM | POA: Diagnosis not present

## 2020-04-05 DIAGNOSIS — H5203 Hypermetropia, bilateral: Secondary | ICD-10-CM | POA: Diagnosis not present

## 2020-06-10 DIAGNOSIS — E7800 Pure hypercholesterolemia, unspecified: Secondary | ICD-10-CM | POA: Diagnosis not present

## 2020-06-10 DIAGNOSIS — F329 Major depressive disorder, single episode, unspecified: Secondary | ICD-10-CM | POA: Diagnosis not present

## 2020-06-10 DIAGNOSIS — I10 Essential (primary) hypertension: Secondary | ICD-10-CM | POA: Diagnosis not present

## 2020-06-10 DIAGNOSIS — E782 Mixed hyperlipidemia: Secondary | ICD-10-CM | POA: Diagnosis not present

## 2020-06-10 DIAGNOSIS — E785 Hyperlipidemia, unspecified: Secondary | ICD-10-CM | POA: Diagnosis not present

## 2020-08-11 ENCOUNTER — Ambulatory Visit: Payer: Medicare HMO | Admitting: Cardiovascular Disease

## 2020-08-11 NOTE — Progress Notes (Signed)
Cardiology Office Note  Date:  08/13/2020   ID:  Heidi Carter, DOB 27-May-1946, MRN [id number]  PCP:  Merri Brunette, MD   Chief Complaint  Patient presents with  . Follow-up    12 month F/U-No new cardiac concerns    HPI:  Ms. Heidi Carter is a 74 yo woman with PMHx of  HTN  seizures  2017 showing moderate aortic valve regurgitation  2018 with mild to moderate AI Who presents for follow-up of her hypertension and aortic valve disease  Echo 2019, reviewed on today's visit Normal EF, systolic function good Aortic valve with mild leak/regurg Was previously moderate leak by outside echo several years ago, then mild to moderate, now mild on our review  Reports that she feels well in general Denies any chest pain or shortness of breath on exertion  BP at home 119/70  Lab work reviewed HBA1C 5.8 Total chol 172 LDL LDL 88   tragic loss of her daughter in early 2020  Other medication intolerances discussed Off bystolic, held last office visit for bradycardia Reports that she was given a prescription for spironolactone, has not started yet  EKG personally reviewed by myself on todays visit Shows sinus bradycardia rate 58 bpm nonspecific ST abnormality, no change from EKG dating back to 2016  Review of prior echocardiogram from 2017 showing moderate aortic valve regurgitation normal ejection fraction Notes from Dr. Donnie Aho in December 2018 documenting mild to moderate AI   PMH:   has a past medical history of Hyperlipidemia, Hypertension, Leaky heart valve, and Seizures ([facility]).  PSH:    Past Surgical History:  Procedure Laterality Date  . ABDOMINAL HYSTERECTOMY    . TUBAL LIGATION      Current Outpatient Medications  Medication Sig Dispense Refill  . amLODipine (NORVASC) 10 MG tablet Take 10 mg by mouth daily.    Marland Kitchen aspirin EC 81 MG tablet Take 81 mg by mouth every morning.    . carbamazepine (TEGRETOL) 200 MG tablet Take 200 mg by mouth daily.    . cetirizine (ZYRTEC)  10 MG tablet Take 10 mg by mouth daily.    . Cholecalciferol (VITAMIN D PO) Take 1 capsule by mouth daily.    Marland Kitchen doxazosin (CARDURA) 2 MG tablet Take 1 tablet (2 mg total) by mouth daily. 90 tablet 0  . ferrous fumarate (HEMOCYTE - 106 MG FE) 325 (106 FE) MG TABS tablet Take 1 tablet by mouth every morning.    . pantoprazole (PROTONIX) 40 MG tablet Take 40 mg by mouth daily.     . rosuvastatin (CRESTOR) 10 MG tablet Take 10 mg by mouth daily.    Marland Kitchen telmisartan (MICARDIS) 80 MG tablet Take 80 mg by mouth daily.     No current facility-administered medications for this visit.     Allergies:   Patient has no known allergies.   Social History:  The patient  reports that she has never smoked. She has never used smokeless tobacco. She reports that she does not drink alcohol and does not use drugs.   Family History:   family history includes Clotting disorder in her sister.    Review of Systems: Review of Systems  Constitutional: Negative.   HENT: Negative.   Respiratory: Negative.   Cardiovascular: Negative.   Gastrointestinal: Negative.   Musculoskeletal: Negative.   Neurological: Negative.   Psychiatric/Behavioral: Negative.   All other systems reviewed and are negative.   PHYSICAL EXAM: VS:  BP (!) 160/76 (BP Location: Left Arm, Patient Position: Sitting,  Cuff Size: Normal)   Pulse (!) 58   Ht 5\' 1"  (1.549 m)   Wt 122 lb (55.3 kg)   SpO2 98%   BMI 23.05 kg/m  , BMI Body mass index is 23.05 kg/m. Constitutional:  oriented to person, place, and time. No distress.  HENT:  Head: Grossly normal Eyes:  no discharge. No scleral icterus.  Neck: No JVD, no carotid bruits  Cardiovascular: Regular rate and rhythm, no murmurs appreciated Pulmonary/Chest: Clear to auscultation bilaterally, no wheezes or rails Abdominal: Soft.  no distension.  no tenderness.  Musculoskeletal: Normal range of motion Neurological:  normal muscle tone. Coordination normal. No atrophy Skin: Skin warm and  dry Psychiatric: normal affect, pleasant  Recent Labs: 02/18/2020: BUN 8; Creatinine, Ser 0.65; Potassium 4.6; Sodium 138    Lipid Panel No results found for: CHOL, HDL, LDLCALC, TRIG    Wt Readings from Last 3 Encounters:  08/13/20 122 lb (55.3 kg)  01/28/20 124 lb 8 oz (56.5 kg)  08/12/19 124 lb 4 oz (56.4 kg)     ASSESSMENT AND PLAN:  Benign essential HTN - BP elevated in office Improved on recheck, numbers updated, down 20 points systolic No medication changes made at this time  Nonrheumatic aortic valve insufficiency - Reports indicating was moderate in 2017, outside study Mild to moderate 2018, outside study Mild in December 2019, available through our system for review No murmur No changes on today's visit No indication for repeat echo  Sinus bradycardia bystolic stopped last visit, rate better     Total encounter time more than 25 minutes  Greater than 50% was spent in counseling and coordination of care with the patient    No orders of the defined types were placed in this encounter.    Signed, 04-24-1981, M.D., Ph.D. 08/13/2020  [facility] [address], [place]

## 2020-08-13 ENCOUNTER — Other Ambulatory Visit: Payer: Self-pay

## 2020-08-13 ENCOUNTER — Encounter: Payer: Self-pay | Admitting: Cardiovascular Disease

## 2020-08-13 ENCOUNTER — Ambulatory Visit: Payer: Medicare HMO | Admitting: Cardiovascular Disease

## 2020-08-13 VITALS — BP 138/76 | HR 58 | Ht 61.0 in | Wt 122.0 lb

## 2020-08-13 DIAGNOSIS — R002 Palpitations: Secondary | ICD-10-CM

## 2020-08-13 DIAGNOSIS — I10 Essential (primary) hypertension: Secondary | ICD-10-CM | POA: Diagnosis not present

## 2020-08-13 DIAGNOSIS — E782 Mixed hyperlipidemia: Secondary | ICD-10-CM | POA: Diagnosis not present

## 2020-08-13 DIAGNOSIS — I351 Nonrheumatic aortic (valve) insufficiency: Secondary | ICD-10-CM

## 2020-08-13 DIAGNOSIS — R079 Chest pain, unspecified: Secondary | ICD-10-CM

## 2020-08-13 NOTE — Patient Instructions (Signed)
Medication Instructions:  No changes  If you need a refill on your cardiac medications before your next appointment, please call your pharmacy.    Lab work: No new labs needed   If you have labs (blood work) drawn today and your tests are completely normal, you will receive your results only by: . MyChart Message (if you have MyChart) OR . A paper copy in the mail If you have any lab test that is abnormal or we need to change your treatment, we will call you to review the results.   Testing/Procedures: No new testing needed   Follow-Up: At [facility], you and your health needs are our priority.  As part of our continuing mission to provide you with exceptional heart care, we have created designated Provider Care Teams.  These Care Teams include your primary Cardiologist (physician) and Advanced Practice Providers (APPs -  Physician Assistants and Nurse Practitioners) who all work together to provide you with the care you need, when you need it.  . You will need a follow up appointment as needed  . Providers on your designated Care Team:   . Christopher Berge, NP . Ryan Dunn, PA-C . Jacquelyn Visser, PA-C  Any Other Special Instructions Will Be Listed Below (If Applicable).  COVID-19 Vaccine Information can be found at: [email or web address] For questions related to vaccine distribution or appointments, please email [email or web address] or call [phone].     

## 2020-08-25 ENCOUNTER — Other Ambulatory Visit: Payer: Self-pay | Admitting: Family Medicine

## 2020-08-25 DIAGNOSIS — Z1231 Encounter for screening mammogram for malignant neoplasm of breast: Secondary | ICD-10-CM

## 2020-10-21 ENCOUNTER — Ambulatory Visit: Payer: Medicare HMO

## 2020-10-21 DIAGNOSIS — G40909 Epilepsy, unspecified, not intractable, without status epilepticus: Secondary | ICD-10-CM | POA: Diagnosis not present

## 2020-10-21 DIAGNOSIS — E782 Mixed hyperlipidemia: Secondary | ICD-10-CM | POA: Diagnosis not present

## 2020-10-21 DIAGNOSIS — E7800 Pure hypercholesterolemia, unspecified: Secondary | ICD-10-CM | POA: Diagnosis not present

## 2020-10-21 DIAGNOSIS — F329 Major depressive disorder, single episode, unspecified: Secondary | ICD-10-CM | POA: Diagnosis not present

## 2020-10-21 DIAGNOSIS — K219 Gastro-esophageal reflux disease without esophagitis: Secondary | ICD-10-CM | POA: Diagnosis not present

## 2020-10-21 DIAGNOSIS — Z23 Encounter for immunization: Secondary | ICD-10-CM | POA: Diagnosis not present

## 2020-10-21 DIAGNOSIS — R7309 Other abnormal glucose: Secondary | ICD-10-CM | POA: Diagnosis not present

## 2020-10-21 DIAGNOSIS — I10 Essential (primary) hypertension: Secondary | ICD-10-CM | POA: Diagnosis not present

## 2020-10-21 DIAGNOSIS — Z1389 Encounter for screening for other disorder: Secondary | ICD-10-CM | POA: Diagnosis not present

## 2020-10-21 DIAGNOSIS — Z0000 Encounter for general adult medical examination without abnormal findings: Secondary | ICD-10-CM | POA: Diagnosis not present

## 2020-10-21 DIAGNOSIS — E785 Hyperlipidemia, unspecified: Secondary | ICD-10-CM | POA: Diagnosis not present

## 2020-12-07 ENCOUNTER — Ambulatory Visit: Payer: Medicare HMO

## 2020-12-07 DIAGNOSIS — M79605 Pain in left leg: Secondary | ICD-10-CM | POA: Diagnosis not present

## 2020-12-07 DIAGNOSIS — I739 Peripheral vascular disease, unspecified: Secondary | ICD-10-CM | POA: Diagnosis not present

## 2020-12-08 ENCOUNTER — Other Ambulatory Visit: Payer: Self-pay | Admitting: Family Medicine

## 2020-12-08 DIAGNOSIS — I739 Peripheral vascular disease, unspecified: Secondary | ICD-10-CM

## 2020-12-13 ENCOUNTER — Other Ambulatory Visit: Payer: Self-pay

## 2020-12-13 ENCOUNTER — Ambulatory Visit
Admission: RE | Admit: 2020-12-13 | Discharge: 2020-12-13 | Disposition: A | Discharge status: Home / Self Care | Payer: Medicare HMO | Source: Ambulatory Visit | Attending: Family Medicine | Admitting: Family Medicine

## 2020-12-13 DIAGNOSIS — R531 Weakness: Secondary | ICD-10-CM | POA: Diagnosis not present

## 2020-12-13 DIAGNOSIS — I10 Essential (primary) hypertension: Secondary | ICD-10-CM | POA: Diagnosis not present

## 2020-12-13 DIAGNOSIS — I998 Other disorder of circulatory system: Secondary | ICD-10-CM | POA: Diagnosis not present

## 2020-12-13 DIAGNOSIS — I739 Peripheral vascular disease, unspecified: Secondary | ICD-10-CM

## 2021-01-11 ENCOUNTER — Other Ambulatory Visit: Payer: Self-pay

## 2021-01-11 ENCOUNTER — Ambulatory Visit
Admission: RE | Admit: 2021-01-11 | Discharge: 2021-01-11 | Disposition: A | Discharge status: Home / Self Care | Payer: Medicare HMO | Source: Ambulatory Visit | Attending: Family Medicine | Admitting: Family Medicine

## 2021-01-11 DIAGNOSIS — Z1231 Encounter for screening mammogram for malignant neoplasm of breast: Secondary | ICD-10-CM | POA: Diagnosis not present

## 2021-01-17 DIAGNOSIS — I10 Essential (primary) hypertension: Secondary | ICD-10-CM | POA: Diagnosis not present

## 2021-01-17 DIAGNOSIS — E7800 Pure hypercholesterolemia, unspecified: Secondary | ICD-10-CM | POA: Diagnosis not present

## 2021-01-17 DIAGNOSIS — K219 Gastro-esophageal reflux disease without esophagitis: Secondary | ICD-10-CM | POA: Diagnosis not present

## 2021-01-17 DIAGNOSIS — E782 Mixed hyperlipidemia: Secondary | ICD-10-CM | POA: Diagnosis not present

## 2021-01-17 DIAGNOSIS — F329 Major depressive disorder, single episode, unspecified: Secondary | ICD-10-CM | POA: Diagnosis not present

## 2021-01-17 DIAGNOSIS — E785 Hyperlipidemia, unspecified: Secondary | ICD-10-CM | POA: Diagnosis not present

## 2021-04-01 DIAGNOSIS — I10 Essential (primary) hypertension: Secondary | ICD-10-CM | POA: Diagnosis not present

## 2021-04-01 DIAGNOSIS — E7800 Pure hypercholesterolemia, unspecified: Secondary | ICD-10-CM | POA: Diagnosis not present

## 2021-04-01 DIAGNOSIS — E782 Mixed hyperlipidemia: Secondary | ICD-10-CM | POA: Diagnosis not present

## 2021-04-01 DIAGNOSIS — F329 Major depressive disorder, single episode, unspecified: Secondary | ICD-10-CM | POA: Diagnosis not present

## 2021-04-01 DIAGNOSIS — K219 Gastro-esophageal reflux disease without esophagitis: Secondary | ICD-10-CM | POA: Diagnosis not present

## 2021-04-20 DIAGNOSIS — I10 Essential (primary) hypertension: Secondary | ICD-10-CM | POA: Diagnosis not present

## 2021-04-20 DIAGNOSIS — K219 Gastro-esophageal reflux disease without esophagitis: Secondary | ICD-10-CM | POA: Diagnosis not present

## 2021-04-20 DIAGNOSIS — I700 Atherosclerosis of aorta: Secondary | ICD-10-CM | POA: Diagnosis not present

## 2021-04-20 DIAGNOSIS — E782 Mixed hyperlipidemia: Secondary | ICD-10-CM | POA: Diagnosis not present

## 2021-04-20 DIAGNOSIS — R7303 Prediabetes: Secondary | ICD-10-CM | POA: Diagnosis not present

## 2021-04-20 DIAGNOSIS — G40909 Epilepsy, unspecified, not intractable, without status epilepticus: Secondary | ICD-10-CM | POA: Diagnosis not present

## 2021-05-24 DIAGNOSIS — R053 Chronic cough: Secondary | ICD-10-CM | POA: Diagnosis not present

## 2021-06-03 ENCOUNTER — Ambulatory Visit
Admission: RE | Admit: 2021-06-03 | Discharge: 2021-06-03 | Disposition: A | Discharge status: Home / Self Care | Payer: Medicare HMO | Source: Ambulatory Visit | Attending: Family Medicine | Admitting: Family Medicine

## 2021-06-03 ENCOUNTER — Other Ambulatory Visit: Payer: Self-pay | Admitting: Family Medicine

## 2021-06-03 DIAGNOSIS — R053 Chronic cough: Secondary | ICD-10-CM

## 2021-06-03 DIAGNOSIS — R059 Cough, unspecified: Secondary | ICD-10-CM | POA: Diagnosis not present

## 2021-06-28 DIAGNOSIS — R059 Cough, unspecified: Secondary | ICD-10-CM | POA: Diagnosis not present

## 2021-07-24 DIAGNOSIS — R059 Cough, unspecified: Secondary | ICD-10-CM | POA: Diagnosis not present

## 2021-07-24 DIAGNOSIS — M791 Myalgia, unspecified site: Secondary | ICD-10-CM | POA: Diagnosis not present

## 2021-07-24 DIAGNOSIS — R6883 Chills (without fever): Secondary | ICD-10-CM | POA: Diagnosis not present

## 2021-07-24 DIAGNOSIS — J09X2 Influenza due to identified novel influenza A virus with other respiratory manifestations: Secondary | ICD-10-CM | POA: Diagnosis not present

## 2021-07-24 DIAGNOSIS — R1110 Vomiting, unspecified: Secondary | ICD-10-CM | POA: Diagnosis not present

## 2021-07-24 DIAGNOSIS — R519 Headache, unspecified: Secondary | ICD-10-CM | POA: Diagnosis not present

## 2021-07-31 DIAGNOSIS — J189 Pneumonia, unspecified organism: Secondary | ICD-10-CM | POA: Diagnosis not present

## 2021-07-31 DIAGNOSIS — Z20828 Contact with and (suspected) exposure to other viral communicable diseases: Secondary | ICD-10-CM | POA: Diagnosis not present

## 2021-09-14 ENCOUNTER — Other Ambulatory Visit: Payer: Self-pay | Admitting: Family Medicine

## 2021-09-14 DIAGNOSIS — Z1231 Encounter for screening mammogram for malignant neoplasm of breast: Secondary | ICD-10-CM

## 2021-09-19 ENCOUNTER — Telehealth: Payer: Self-pay | Admitting: Cardiovascular Disease

## 2021-09-19 NOTE — Telephone Encounter (Signed)
Patient called back regarding numbness/tingling in left arm.   Reports that she had the flu in November and was sick for about 2 and a half weeks and "hasn't been right since."   Thinks that her heart has been speeding up and slowing down. Reports that when she checked her blood pressure this morning her monitor said that he heart rate was 70.   Reports that while she was sick her blood pressure was really low but this morning her blood pressure was 140/79.   Reports that she has had numbness and tingling in the left arm for the last several weeks. Denies any issues with gripping things, or dropping things. Reports intermittent pain in left shoulder as well as neck.   Headache for several weeks, comes and goes, aching in nature, she thinks in the front part of her head. Reports that she was nauseated and dizzy off and on, but has not vomited. Has blurred vision but reports that this is normal for her.   Explained to patient that this was not likely related to her heart and that she should either go to urgent care or call her PCP for follow up. Pt verbalized understanding.

## 2021-09-19 NOTE — Telephone Encounter (Signed)
Patient states she is experiencing numbness and tingling in her left arm down into her fingers. States she also has had a headache. She states these symptoms have been going on for about a week. Please call to discuss.  Patient has an appointment on 2/2

## 2021-09-21 NOTE — Telephone Encounter (Signed)
Heidi Iba, MD  P Cv Div [facility] Triage Caller: Unspecified (2 days ago,  8:57 AM) I would probably agree, symptoms described do sound like cervical radiculopathy  Possibly from pinching of nerves in the neck causing even headache, numb and tingling down left arm  Would recommend talking with primary care, may need x-ray/MRI if symptoms persist  Avoid heavy lifting, make sure comfortable supportive pillow  Thx  TG

## 2021-09-21 NOTE — Telephone Encounter (Signed)
Patient made aware of Dr. Windell Hummingbird response and recommendation. Patient will f/u with her pcp as recommended. Pts appt with CF for next week has been cancelled. Patient will contact the office if cardiac symptoms develop.

## 2021-09-29 ENCOUNTER — Ambulatory Visit: Payer: Medicare HMO | Admitting: Medical

## 2021-10-12 DIAGNOSIS — M79605 Pain in left leg: Secondary | ICD-10-CM | POA: Diagnosis not present

## 2021-10-14 DIAGNOSIS — I10 Essential (primary) hypertension: Secondary | ICD-10-CM | POA: Diagnosis not present

## 2021-10-14 DIAGNOSIS — E782 Mixed hyperlipidemia: Secondary | ICD-10-CM | POA: Diagnosis not present

## 2021-11-07 DIAGNOSIS — E782 Mixed hyperlipidemia: Secondary | ICD-10-CM | POA: Diagnosis not present

## 2021-11-07 DIAGNOSIS — I10 Essential (primary) hypertension: Secondary | ICD-10-CM | POA: Diagnosis not present

## 2021-11-07 DIAGNOSIS — I700 Atherosclerosis of aorta: Secondary | ICD-10-CM | POA: Diagnosis not present

## 2021-11-07 DIAGNOSIS — K219 Gastro-esophageal reflux disease without esophagitis: Secondary | ICD-10-CM | POA: Diagnosis not present

## 2021-11-07 DIAGNOSIS — G40909 Epilepsy, unspecified, not intractable, without status epilepticus: Secondary | ICD-10-CM | POA: Diagnosis not present

## 2021-11-07 DIAGNOSIS — R7303 Prediabetes: Secondary | ICD-10-CM | POA: Diagnosis not present

## 2021-11-07 DIAGNOSIS — Z1159 Encounter for screening for other viral diseases: Secondary | ICD-10-CM | POA: Diagnosis not present

## 2021-11-07 DIAGNOSIS — Z1389 Encounter for screening for other disorder: Secondary | ICD-10-CM | POA: Diagnosis not present

## 2021-11-07 DIAGNOSIS — Z0000 Encounter for general adult medical examination without abnormal findings: Secondary | ICD-10-CM | POA: Diagnosis not present

## 2022-01-02 DIAGNOSIS — I10 Essential (primary) hypertension: Secondary | ICD-10-CM | POA: Diagnosis not present

## 2022-01-02 DIAGNOSIS — E7800 Pure hypercholesterolemia, unspecified: Secondary | ICD-10-CM | POA: Diagnosis not present

## 2022-01-12 ENCOUNTER — Ambulatory Visit: Payer: Medicare HMO

## 2022-01-13 ENCOUNTER — Ambulatory Visit
Admission: RE | Admit: 2022-01-13 | Discharge: 2022-01-13 | Disposition: A | Discharge status: Home / Self Care | Payer: Medicare HMO | Source: Ambulatory Visit | Attending: Family Medicine | Admitting: Family Medicine

## 2022-01-13 DIAGNOSIS — Z1231 Encounter for screening mammogram for malignant neoplasm of breast: Secondary | ICD-10-CM

## 2022-03-16 DIAGNOSIS — H524 Presbyopia: Secondary | ICD-10-CM | POA: Diagnosis not present

## 2022-06-22 ENCOUNTER — Telehealth: Payer: Self-pay | Admitting: Cardiovascular Disease

## 2022-06-22 NOTE — Telephone Encounter (Signed)
Left message to call [phone].

## 2022-06-22 NOTE — Telephone Encounter (Signed)
Pt calling in states she has been having headaches, lightheadedness, and some armpit pain. Pt states her symptoms come and go and would like to talk to RN about it. Pt made a f/u for 07/12/22 with Sharolyn Douglas, NP.

## 2022-06-26 NOTE — Telephone Encounter (Signed)
Pt states that she saw her PCP and her BP was elevated and they wanted to adjust her medications. Pt states that she has changed from taking her BP medication at night to the morning and that it has helped some. Pt states that being lightheaded comes and goes. Advised to keep a BP log 1-2 hours after her medications and bring to her appointment will help the provider. Pt verbalized understanding and had no additional questions. Pt does not have any readings at this time with her but states it is not to high at home.

## 2022-07-12 ENCOUNTER — Other Ambulatory Visit
Admission: RE | Admit: 2022-07-12 | Discharge: 2022-07-12 | Disposition: A | Discharge status: Home / Self Care | Payer: Medicare HMO | Source: Ambulatory Visit | Attending: Nurse Practitioner | Admitting: Nurse Practitioner

## 2022-07-12 ENCOUNTER — Ambulatory Visit (INDEPENDENT_AMBULATORY_CARE_PROVIDER_SITE_OTHER): Payer: Medicare HMO

## 2022-07-12 ENCOUNTER — Ambulatory Visit: Payer: Medicare HMO | Attending: Nurse Practitioner | Admitting: Nurse Practitioner

## 2022-07-12 ENCOUNTER — Encounter: Payer: Self-pay | Admitting: Nurse Practitioner

## 2022-07-12 VITALS — BP 108/80 | HR 52 | Ht 61.0 in | Wt 120.2 lb

## 2022-07-12 DIAGNOSIS — E782 Mixed hyperlipidemia: Secondary | ICD-10-CM | POA: Diagnosis not present

## 2022-07-12 DIAGNOSIS — R002 Palpitations: Secondary | ICD-10-CM | POA: Insufficient documentation

## 2022-07-12 DIAGNOSIS — I10 Essential (primary) hypertension: Secondary | ICD-10-CM

## 2022-07-12 LAB — MAGNESIUM: Magnesium: 2.2 mg/dL (ref 1.7–2.4)

## 2022-07-12 LAB — BASIC METABOLIC PANEL
Anion gap: 4 — ABNORMAL LOW (ref 5–15)
BUN: 10 mg/dL (ref 8–23)
CO2: 29 mmol/L (ref 22–32)
Calcium: 9.1 mg/dL (ref 8.9–10.3)
Chloride: 103 mmol/L (ref 98–111)
Creatinine, Ser: 0.61 mg/dL (ref 0.44–1.00)
GFR, Estimated: >60 mL/min (ref >60)
Glucose, Bld: 111 mg/dL — ABNORMAL HIGH (ref 70–99)
Potassium: 3.9 mmol/L (ref 3.5–5.1)
Sodium: 136 mmol/L (ref 135–145)

## 2022-07-12 LAB — TSH: TSH: 1.662 u[IU]/mL (ref 0.350–4.500)

## 2022-07-12 NOTE — Patient Instructions (Signed)
Medication Instructions:   Your physician recommends that you continue on your current medications as directed. Please refer to the Current Medication list given to you today.  *If you need a refill on your cardiac medications before your next appointment, please call your pharmacy*   Lab Work:  Your physician recommends you go to the medical mall to have lab work completed.   If you have labs (blood work) drawn today and your tests are completely normal, you will receive your results only by: MyChart Message (if you have MyChart) OR A paper copy in the mail If you have any lab test that is abnormal or we need to change your treatment, we will call you to review the results.   Testing/Procedures:  Your physician has recommended that you wear a Zio monitor.   This monitor is a medical device that records the heart's electrical activity. Doctors most often use these monitors to diagnose arrhythmias. Arrhythmias are problems with the speed or rhythm of the heartbeat. The monitor is a small device applied to your chest. You can wear one while you do your normal daily activities. While wearing this monitor if you have any symptoms to push the button and record what you felt. Once you have worn this monitor for the period of time provider prescribed (Usually 14 days), you will return the monitor device in the postage paid box. Once it is returned they will download the data collected and provide [place] with a report which the provider will then review and we will call you with those results. Important tips:  Avoid showering during the first 24 hours of wearing the monitor. Avoid excessive sweating to help maximize wear time. Do not submerge the device, no hot tubs, and no swimming pools. Keep any lotions or oils away from the patch. After 24 hours you may shower with the patch on. Take brief showers with your back facing the shower head.  Do not remove patch once it has been placed because that  will interrupt data and decrease adhesive wear time. Push the button when you have any symptoms and write down what you were feeling. Once you have completed wearing your monitor, remove and place into box which has postage paid and place in your outgoing mailbox.  If for some reason you have misplaced your box then call our office and we can provide another box and/or mail it off for you.     Follow-Up: At [facility], you and your health needs are our priority.  As part of our continuing mission to provide you with exceptional heart care, we have created designated Provider Care Teams.  These Care Teams include your primary Cardiologist (physician) and Advanced Practice Providers (APPs -  Physician Assistants and Nurse Practitioners) who all work together to provide you with the care you need, when you need it.  We recommend signing up for the patient portal called "MyChart".  Sign up information is provided on this After Visit Summary.  MyChart is used to connect with patients for Virtual Visits (Telemedicine).  Patients are able to view lab/test results, encounter notes, upcoming appointments, etc.  Non-urgent messages can be sent to your provider as well.   To learn more about what you can do with MyChart, go to [email or web address].    Your next appointment:   1 month(s)  The format for your next appointment:   In Person  Provider:   You may see Julien Nordmann, MD or one of the following Advanced  Practice Providers on your designated Care Team:   Nicolasa Ducking, NP Eula Listen, PA-C Cadence Fransico Michael, PA-C Charlsie Quest, NP

## 2022-07-12 NOTE — Progress Notes (Addendum)
Cardiology Clinic Note   Patient Name: PUANANI GENE Date of Encounter: 07/12/2022  Primary Care Provider:  Merri Brunette, MD Primary Cardiologist:  Julien Nordmann, MD  Patient Profile    Jones Skene. Rozzell is a 76 y.o. female with a history of hypertension, hyperlipidemia, palpitations, seizures, and mild MR, and  nonrheumatic aortic valve insufficiency who presents today for a follow-up related to palpitations.   Past Medical History    Past Medical History:  Diagnosis Date   Chest pain    a. 02/2020 Cor CTA: Ca2+ = 0.  Nl cors.   Diastolic dysfunction    a. 07/2018 Echo: EF 55-60%, GrI DD, mildly dil LA, mild MR.   Hyperlipidemia    Hypertension    Mitral regurgitation    a. 07/2018 Echo: mild MR.   Palpitations    Seizures (HCC)    Past Surgical History:  Procedure Laterality Date   ABDOMINAL HYSTERECTOMY     TUBAL LIGATION      Allergies  No Known Allergies  History of Present Illness    Marina L. Mazzoni is a 76 y.o. female with a history of hypertension, hyperlipidemia, seizures, and nonrheumatic aortic valve insufficiency.  She established care with Dr. Mariah Milling in 2019 for blood pressure control and was found to be bradycardic (HR 49) on beta-blocker so this was discontinued.  According to previous cardiology notes, echocardiogram completed in 2017 suggested moderate aortic valve regurgitation.  Echocardiogram was repeated in December 2019 and suggested an LVEF of 55-60%, Gr1 DD, and mild AR.  A coronary CTA was completed in July 2021 in the setting of chest pain and palpitations that suggested no evidence of CAD and a coronary calcium score of 0.  She declined a Zio monitor during that time to evaluate palpitations.  She was last seen in the office in October 2021  where she was stable from a cardiac standpoint.  Since last being seen she has been well but starting 3-4 months ago, she has been experiencing increased palpitations.  These episodes have been  occurring primarily at night when she is laying down and occur 2-3x/week.  These episodes last about a minute.  She states that recently she had a cough that she was taking OTC cough medicine for and this caused her blood pressure to increase and increased the fluttering sensation  in her chest.  Since stopping the cough medicine, her blood pressure has returned to baseline but the palpitations have persisted.  She denies chest pain, dyspnea on exertion, orthopnea, PND, edema, syncope, and early satiety.   Home Medications    Current Outpatient Medications  Medication Sig Dispense Refill   amLODipine (NORVASC) 10 MG tablet Take 10 mg by mouth daily.     aspirin EC 81 MG tablet Take 81 mg by mouth every morning.     carbamazepine (TEGRETOL) 200 MG tablet Take 200 mg by mouth daily.     cetirizine (ZYRTEC) 10 MG tablet Take 10 mg by mouth daily.     Cholecalciferol (VITAMIN D PO) Take 1 capsule by mouth daily.     ferrous fumarate (HEMOCYTE - 106 MG FE) 325 (106 FE) MG TABS tablet Take 1 tablet by mouth every morning.     pantoprazole (PROTONIX) 40 MG tablet Take 40 mg by mouth daily.      rosuvastatin (CRESTOR) 10 MG tablet Take 10 mg by mouth daily.     spironolactone (ALDACTONE) 25 MG tablet Take 1 tablet (25 mg total) by  mouth daily. 90 tablet 3   telmisartan (MICARDIS) 80 MG tablet Take 80 mg by mouth daily.     No current facility-administered medications for this visit.    Review of Systems    Fluttering sensation in her chest occurring 2-3x/week and lasting about a minute. No chest pain, dyspnea, edema, orthopnea, PND, syncope, or early satiety. All other systems reviewed and are otherwise negative except as noted above.  Physical Exam    VS:  BP 108/80 (BP Location: Left Arm, Patient Position: Sitting, Cuff Size: Normal)   Pulse (!) 52   Ht 5\' 1"  (1.549 m)   Wt 120 lb 4 oz (54.5 kg)   SpO2 98%   BMI 22.72 kg/m  , BMI Body mass index is 22.72 kg/m.     GEN: Well nourished,  well developed, in no acute distress. HEENT: normal. Neck: Supple, no JVD, carotid bruits, or masses. Cardiac: RRR, no murmurs, rubs, or gallops. No clubbing, cyanosis, edema.  Radials/PT 2+ and equal bilaterally.  Respiratory:  Respirations regular and unlabored, clear to auscultation bilaterally. GI: Soft, nontender, nondistended, BS + x 4. MS: no deformity or atrophy. Skin: warm and dry, no rash. Neuro:  Strength and sensation are intact. Psych: Normal affect.  Accessory Clinical Findings    ECG personally reviewed by me today- sinus bradycardia, 52, inferolateral ST depression consistent with previous studies- No acute changes  Lab Results  Component Value Date   WBC 6.0 02/10/2015   HGB 14.5 02/10/2015   HCT 41.5 02/10/2015   MCV 85.9 02/10/2015   PLT 206 02/10/2015   Lab Results  Component Value Date   CREATININE 0.61 07/12/2022   BUN 10 07/12/2022   NA 136 07/12/2022   K 3.9 07/12/2022   CL 103 07/12/2022   CO2 29 07/12/2022   Assessment & Plan   1.  Palpitations: Patient with history of palpitations dating back to July 2021.  Was offered a Zio monitor at that time but she deferred.  She states she noticed episodes of palpitations occurring more frequently starting 3-4 months ago.  Episodes last about a minute but occur 2-3x/week.  We will check a basic metabolic panel, magnesium, and TSH and order a 14 day ZioXT monitor.   2. Hypertension: Blood pressure at goal today (108/80).  Patient takes blood pressure medications at night to avoid lightheadedness.  Continue amlodipine, spirolactone, and Micardis.  Will check basic metabolic panel today.   3. Hyperlipidemia: Being managed by outside primary care physician. Continue statin.   4. Disposition: basic metabolic panel, mag, and TSH today.  Follow-up 1 month after outpatient Zio monitor course is complete.   Murray Hodgkins, NP 07/12/2022, 5:39 PM

## 2022-07-14 DIAGNOSIS — R002 Palpitations: Secondary | ICD-10-CM | POA: Diagnosis not present

## 2022-08-01 DIAGNOSIS — R002 Palpitations: Secondary | ICD-10-CM | POA: Diagnosis not present

## 2022-08-11 NOTE — Progress Notes (Unsigned)
Cardiology Office Note  Date:  08/14/2022   ID:  Heidi Carter, DOB Feb 03, 1946, MRN [id number]  PCP:  Merri Brunette, MD   Chief Complaint  Patient presents with   Follow up Zio monitor     "Doing well." Medications reviewed by the patient verbally.     HPI:  Heidi Carter is a 76 yo woman with PMHx of  HTN  seizures  2017 showing moderate aortic valve regurgitation  2018 with mild to moderate AI 2019 with mild AI Who presents for follow-up of her hypertension and aortic valve disease  Last seen by myself in clinic December  2021  last seen by one of our providers November 2023  at that time was having increasing palpitations  Blood pressure elevated on arrival, improved on recheck Bp at home 134/70,   Zio monitor completed  rare short episodes of tachycardia otherwise no significant arrhythmia  Heidi Carter reports she is feeling well with no significant chest pain or shortness of breath, no leg edema no PND orthopnea Active at baseline Lab work done through primary care including lipid panel  Echo 2019,  Normal EF, systolic function good Aortic valve with mild leak/regurg Was previously moderate leak by outside echo several years ago, then mild to moderate, now mild    tragic loss of her daughter in early 2020  EKG personally reviewed by myself on todays visit Sinus bradycardia rate 54 bpm nonspecific ST abnormality anterolateral leads No change compared to EKG 2021  Other medication intolerances discussed Off bystolic, held last office visit for bradycardia  Review of prior echocardiogram from 2017 showing moderate aortic valve regurgitation normal ejection fraction Notes from Dr. Donnie Aho in December 2018 documenting mild to moderate AI  PMH:   has a past medical history of Chest pain, Diastolic dysfunction, Hyperlipidemia, Hypertension, Mitral regurgitation, Palpitations, and Seizures (HCC).  PSH:    Past Surgical History:  Procedure Laterality Date   ABDOMINAL  HYSTERECTOMY     TUBAL LIGATION      Current Outpatient Medications  Medication Sig Dispense Refill   amLODipine (NORVASC) 10 MG tablet Take 10 mg by mouth daily.     aspirin EC 81 MG tablet Take 81 mg by mouth every morning.     carbamazepine (TEGRETOL) 200 MG tablet Take 200 mg by mouth daily.     cetirizine (ZYRTEC) 10 MG tablet Take 10 mg by mouth daily.     Cholecalciferol (VITAMIN D PO) Take 1 capsule by mouth daily.     ferrous fumarate (HEMOCYTE - 106 MG FE) 325 (106 FE) MG TABS tablet Take 1 tablet by mouth every morning.     pantoprazole (PROTONIX) 40 MG tablet Take 40 mg by mouth daily.      rosuvastatin (CRESTOR) 10 MG tablet Take 10 mg by mouth daily.     spironolactone (ALDACTONE) 25 MG tablet Take 1 tablet (25 mg total) by mouth daily. 90 tablet 3   telmisartan (MICARDIS) 80 MG tablet Take 80 mg by mouth daily.     No current facility-administered medications for this visit.    Allergies:   Patient has no known allergies.   Social History:  The patient  reports that she has never smoked. She has never used smokeless tobacco. She reports that she does not drink alcohol and does not use drugs.   Family History:   family history includes Clotting disorder in her sister.    Review of Systems: Review of Systems  Constitutional: Negative.   HENT: Negative.  Respiratory: Negative.    Cardiovascular: Negative.   Gastrointestinal: Negative.   Musculoskeletal: Negative.   Neurological: Negative.   Psychiatric/Behavioral: Negative.    All other systems reviewed and are negative.   PHYSICAL EXAM: VS:  BP (!) 164/72 (BP Location: Left Arm, Patient Position: Sitting)   Pulse (!) 54   Ht 5\' 1"  (1.549 m)   Wt 120 lb 6 oz (54.6 kg)   SpO2 96%   BMI 22.74 kg/m  , BMI Body mass index is 22.74 kg/m. Constitutional:  oriented to person, place, and time. No distress.  HENT:  Head: Grossly normal Eyes:  no discharge. No scleral icterus.  Neck: No JVD, no carotid bruits   Cardiovascular: Regular rate and rhythm, no murmurs appreciated Pulmonary/Chest: Clear to auscultation bilaterally, no wheezes or rails Abdominal: Soft.  no distension.  no tenderness.  Musculoskeletal: Normal range of motion Neurological:  normal muscle tone. Coordination normal. No atrophy Skin: Skin warm and dry Psychiatric: normal affect, pleasant  Recent Labs: 07/12/2022: BUN 10; Creatinine, Ser 0.61; Magnesium 2.2; Potassium 3.9; Sodium 136; TSH 1.662    Lipid Panel No results found for: "CHOL", "HDL", "LDLCALC", "TRIG"    Wt Readings from Last 3 Encounters:  08/14/22 120 lb 6 oz (54.6 kg)  07/12/22 120 lb 4 oz (54.5 kg)  08/13/20 122 lb (55.3 kg)     ASSESSMENT AND PLAN:  Benign essential HTN - BP elevated in office on initial arrival, dramatic improvement on subsequent rechecks Same as on her last clinic visit No medication changes made at this time She is monitoring blood pressure at home, typically running 130s systolic  Nonrheumatic aortic valve insufficiency - Reports indicating was moderate in 2017, outside study Mild to moderate 2018, outside study Mild in December 2019, available through our system for review No murmur No indication for further workup based on clinical exam  Sinus bradycardia bystolic stopped last visit, rate better Asymptomatic  Palpitations Rare narrow complex tachycardia lasting several seconds at a time, not having significant burden on Zio monitor Will avoid beta-blockers given bradycardia as detailed above, no other significant arrhythmia noted Results discussed    Total encounter time more than 30 minutes  Greater than 50% was spent in counseling and coordination of care with the patient    No orders of the defined types were placed in this encounter.    Signed, 04-24-1981, M.D., Ph.D. 08/14/2022  [facility] [place], [place]

## 2022-08-14 ENCOUNTER — Ambulatory Visit: Payer: Medicare HMO | Attending: Cardiovascular Disease | Admitting: Cardiovascular Disease

## 2022-08-14 ENCOUNTER — Encounter: Payer: Self-pay | Admitting: Cardiovascular Disease

## 2022-08-14 VITALS — BP 140/70 | HR 54 | Ht 61.0 in | Wt 120.4 lb

## 2022-08-14 DIAGNOSIS — I10 Essential (primary) hypertension: Secondary | ICD-10-CM

## 2022-08-14 DIAGNOSIS — E782 Mixed hyperlipidemia: Secondary | ICD-10-CM | POA: Diagnosis not present

## 2022-08-14 DIAGNOSIS — R002 Palpitations: Secondary | ICD-10-CM

## 2022-08-14 MED ORDER — SPIRONOLACTONE 25 MG PO TABS: 25.0000 mg | ORAL_TABLET | Freq: Every day | ORAL | 3 refills | Status: DC

## 2022-08-14 NOTE — Patient Instructions (Signed)
Medication Instructions:  No changes  If you need a refill on your cardiac medications before your next appointment, please call your pharmacy.   Lab work: No new labs needed  Testing/Procedures: No new testing needed  Follow-Up: At [facility], you and your health needs are our priority.  As part of our continuing mission to provide you with exceptional heart care, we have created designated Provider Care Teams.  These Care Teams include your primary Cardiologist (physician) and Advanced Practice Providers (APPs -  Physician Assistants and Nurse Practitioners) who all work together to provide you with the care you need, when you need it.  You will need a follow up appointment in 12 months  Providers on your designated Care Team:   Christopher Berge, NP Ryan Dunn, PA-C Cadence Furth, PA-C  COVID-19 Vaccine Information can be found at: [email or web address] For questions related to vaccine distribution or appointments, please email [email or web address] or call [phone].   

## 2022-09-21 DIAGNOSIS — N23 Unspecified renal colic: Secondary | ICD-10-CM | POA: Diagnosis not present

## 2022-09-21 DIAGNOSIS — N3090 Cystitis, unspecified without hematuria: Secondary | ICD-10-CM | POA: Diagnosis not present

## 2022-09-21 DIAGNOSIS — M791 Myalgia, unspecified site: Secondary | ICD-10-CM | POA: Diagnosis not present

## 2022-09-28 DIAGNOSIS — R8290 Unspecified abnormal findings in urine: Secondary | ICD-10-CM | POA: Diagnosis not present

## 2022-09-28 DIAGNOSIS — M545 Low back pain, unspecified: Secondary | ICD-10-CM | POA: Diagnosis not present

## 2022-09-29 ENCOUNTER — Other Ambulatory Visit: Payer: Self-pay | Admitting: Family Medicine

## 2022-09-29 DIAGNOSIS — Z1231 Encounter for screening mammogram for malignant neoplasm of breast: Secondary | ICD-10-CM

## 2022-12-04 IMAGING — CR DG CHEST 2V
2 series · 2 of 2 positions shown · non-contrast
Comparison: None.

CLINICAL DATA: Chronic productive cough and chest tightness for 3
months

EXAM:
CHEST - 2 VIEW

[w chest pa]
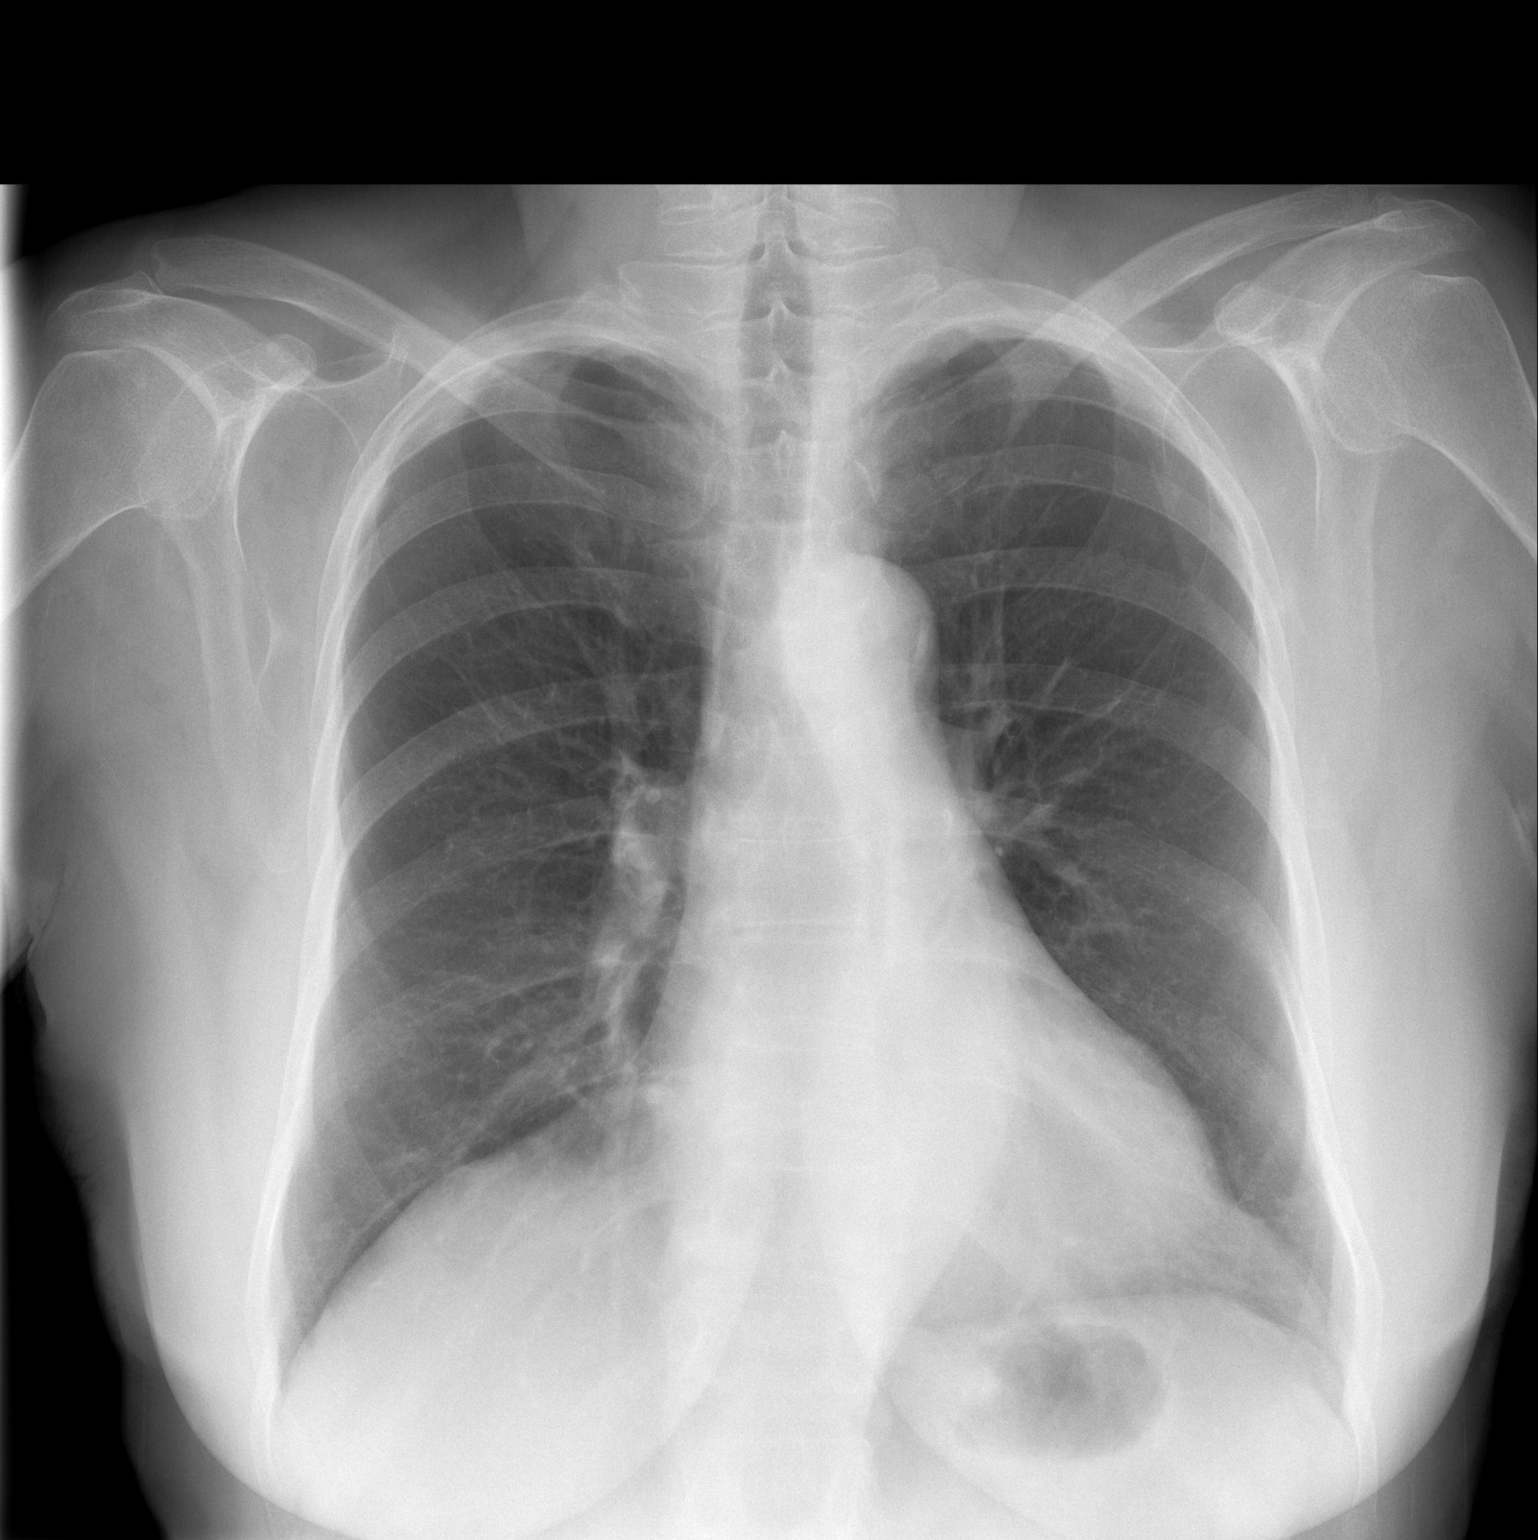

[w chest lat]
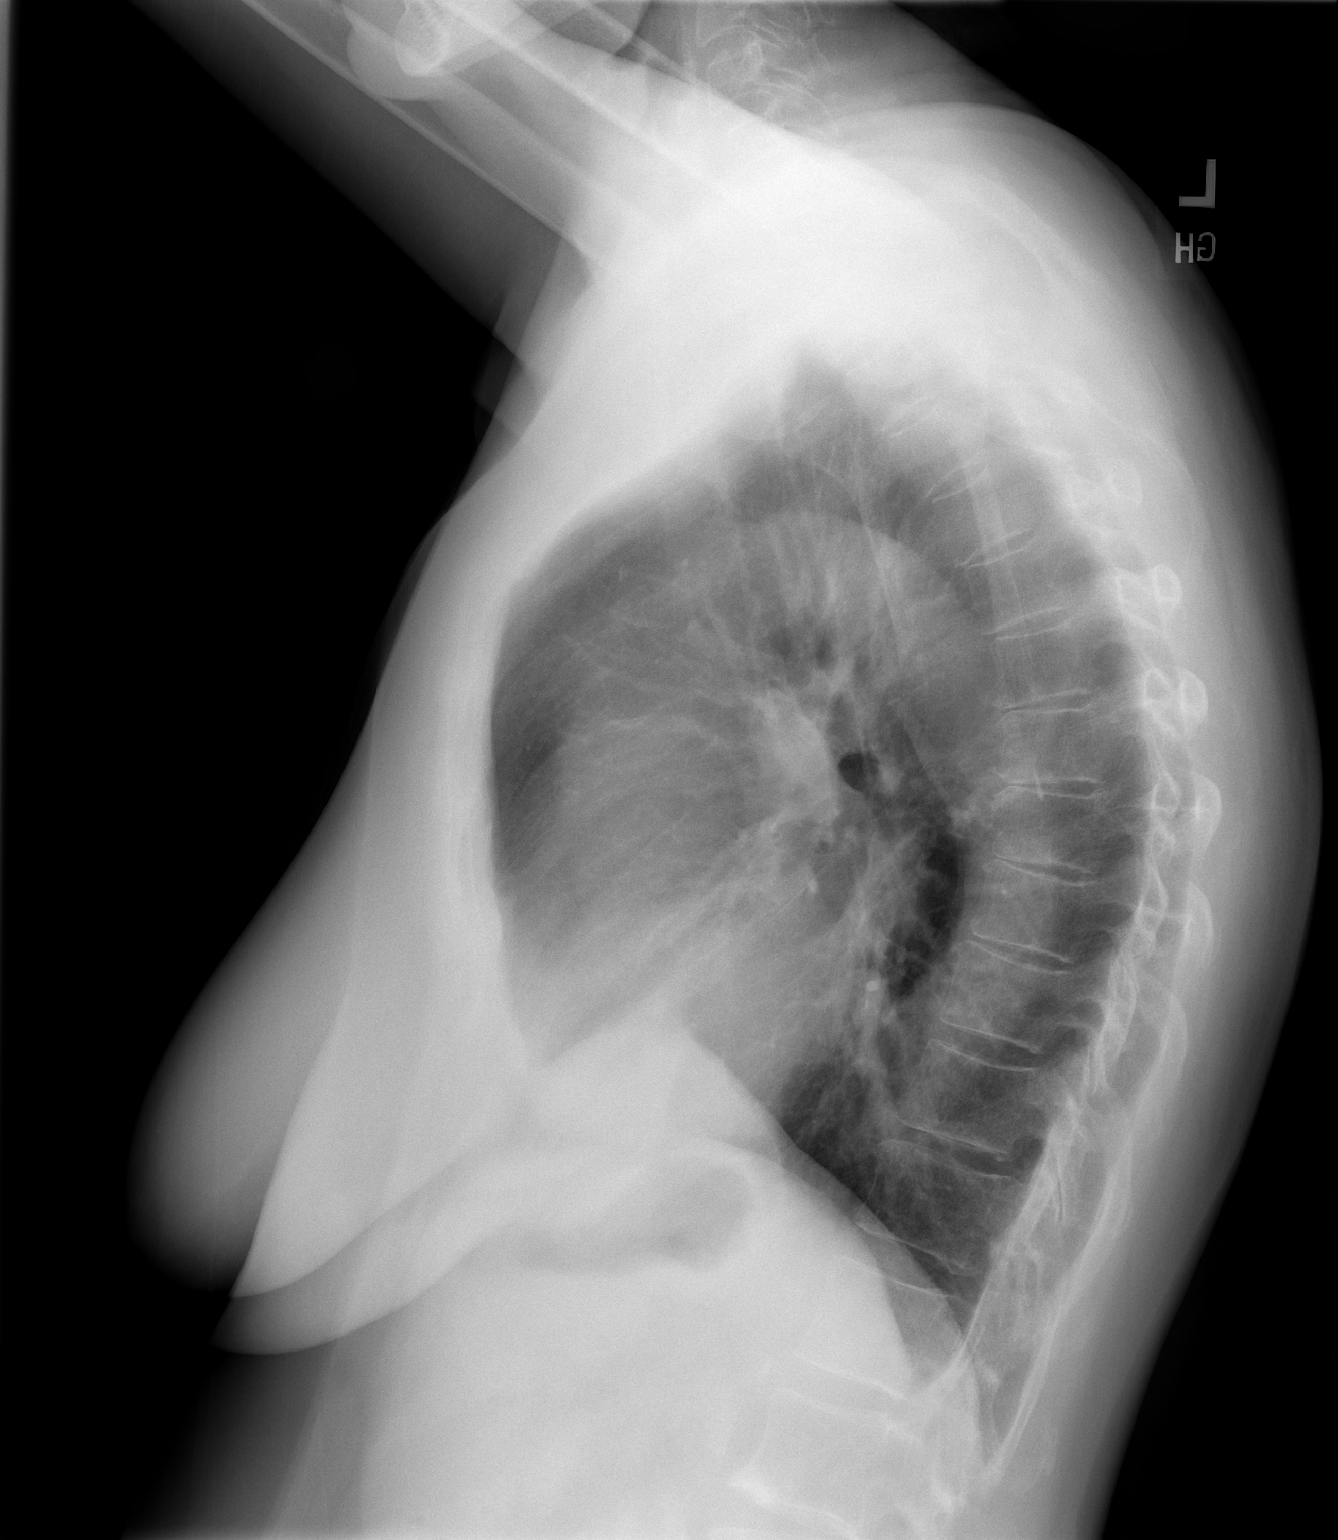

[2 of 2 positions shown; findings below may reference images not displayed]

FINDINGS: Normal heart size. Mildly tortuous atherosclerotic thoracic aorta.
Otherwise normal mediastinal contour. No pneumothorax. No pleural
effusion. Lungs appear clear, with no acute consolidative airspace
disease and no pulmonary edema.
IMPRESSION: No active cardiopulmonary disease.

## 2022-12-13 DIAGNOSIS — E782 Mixed hyperlipidemia: Secondary | ICD-10-CM | POA: Diagnosis not present

## 2022-12-13 DIAGNOSIS — Z1331 Encounter for screening for depression: Secondary | ICD-10-CM | POA: Diagnosis not present

## 2022-12-13 DIAGNOSIS — Z1211 Encounter for screening for malignant neoplasm of colon: Secondary | ICD-10-CM | POA: Diagnosis not present

## 2022-12-13 DIAGNOSIS — G40909 Epilepsy, unspecified, not intractable, without status epilepticus: Secondary | ICD-10-CM | POA: Diagnosis not present

## 2022-12-13 DIAGNOSIS — Z0000 Encounter for general adult medical examination without abnormal findings: Secondary | ICD-10-CM | POA: Diagnosis not present

## 2022-12-13 DIAGNOSIS — I700 Atherosclerosis of aorta: Secondary | ICD-10-CM | POA: Diagnosis not present

## 2022-12-13 DIAGNOSIS — R7309 Other abnormal glucose: Secondary | ICD-10-CM | POA: Diagnosis not present

## 2022-12-13 DIAGNOSIS — I10 Essential (primary) hypertension: Secondary | ICD-10-CM | POA: Diagnosis not present

## 2023-01-15 ENCOUNTER — Ambulatory Visit
Admission: RE | Admit: 2023-01-15 | Discharge: 2023-01-15 | Disposition: A | Discharge status: Home / Self Care | Payer: Medicare HMO | Source: Ambulatory Visit | Attending: Family Medicine | Admitting: Family Medicine

## 2023-01-15 DIAGNOSIS — Z1231 Encounter for screening mammogram for malignant neoplasm of breast: Secondary | ICD-10-CM

## 2023-02-27 DIAGNOSIS — H524 Presbyopia: Secondary | ICD-10-CM | POA: Diagnosis not present

## 2023-02-27 DIAGNOSIS — H25813 Combined forms of age-related cataract, bilateral: Secondary | ICD-10-CM | POA: Diagnosis not present

## 2023-03-13 ENCOUNTER — Other Ambulatory Visit: Payer: Self-pay | Admitting: Family Medicine

## 2023-03-13 DIAGNOSIS — Z13820 Encounter for screening for osteoporosis: Secondary | ICD-10-CM

## 2023-03-14 DIAGNOSIS — E782 Mixed hyperlipidemia: Secondary | ICD-10-CM | POA: Diagnosis not present

## 2023-03-14 DIAGNOSIS — E2839 Other primary ovarian failure: Secondary | ICD-10-CM | POA: Diagnosis not present

## 2023-03-14 DIAGNOSIS — R29898 Other symptoms and signs involving the musculoskeletal system: Secondary | ICD-10-CM | POA: Diagnosis not present

## 2023-03-14 DIAGNOSIS — M549 Dorsalgia, unspecified: Secondary | ICD-10-CM | POA: Diagnosis not present

## 2023-03-28 DIAGNOSIS — A084 Viral intestinal infection, unspecified: Secondary | ICD-10-CM | POA: Diagnosis not present

## 2023-03-28 DIAGNOSIS — I10 Essential (primary) hypertension: Secondary | ICD-10-CM | POA: Diagnosis not present

## 2023-03-28 DIAGNOSIS — E860 Dehydration: Secondary | ICD-10-CM | POA: Diagnosis not present

## 2023-05-22 DIAGNOSIS — E559 Vitamin D deficiency, unspecified: Secondary | ICD-10-CM | POA: Diagnosis not present

## 2023-06-11 ENCOUNTER — Other Ambulatory Visit: Payer: Self-pay | Admitting: Cardiovascular Disease

## 2023-07-16 IMAGING — MG MM DIGITAL SCREENING BILAT W/ TOMO AND CAD
8 series · 9 of 24 positions shown · non-contrast
Comparison: Previous exam(s).

CLINICAL DATA: Screening.

EXAM:
DIGITAL SCREENING BILATERAL MAMMOGRAM WITH TOMOSYNTHESIS AND CAD
TECHNIQUE: Bilateral screening digital craniocaudal and mediolateral oblique
mammograms were obtained. Bilateral screening digital breast
tomosynthesis was performed. The images were evaluated with
computer-aided detection.

[R CC synth-2D]
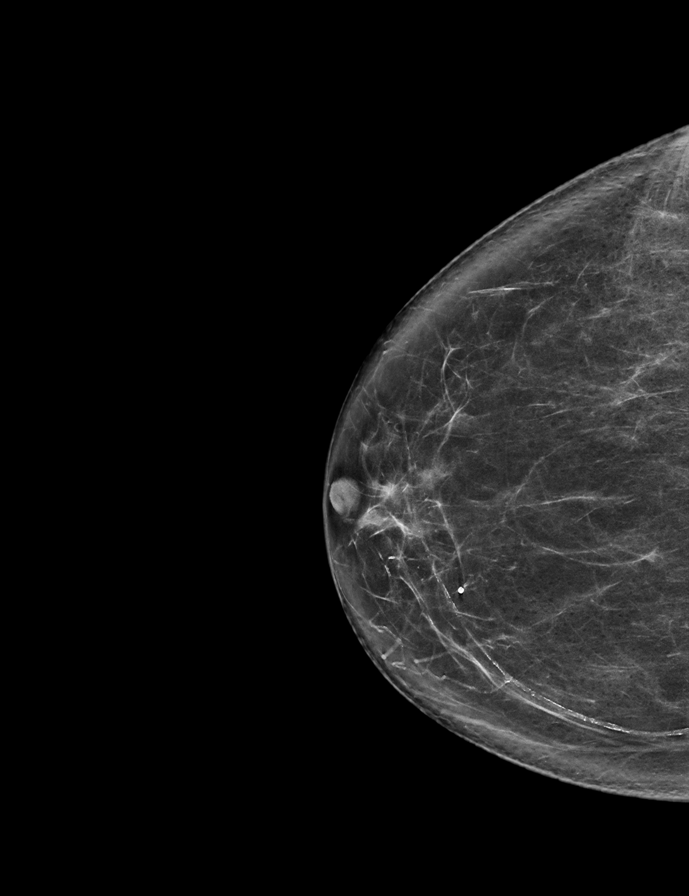

[L CC synth-2D]
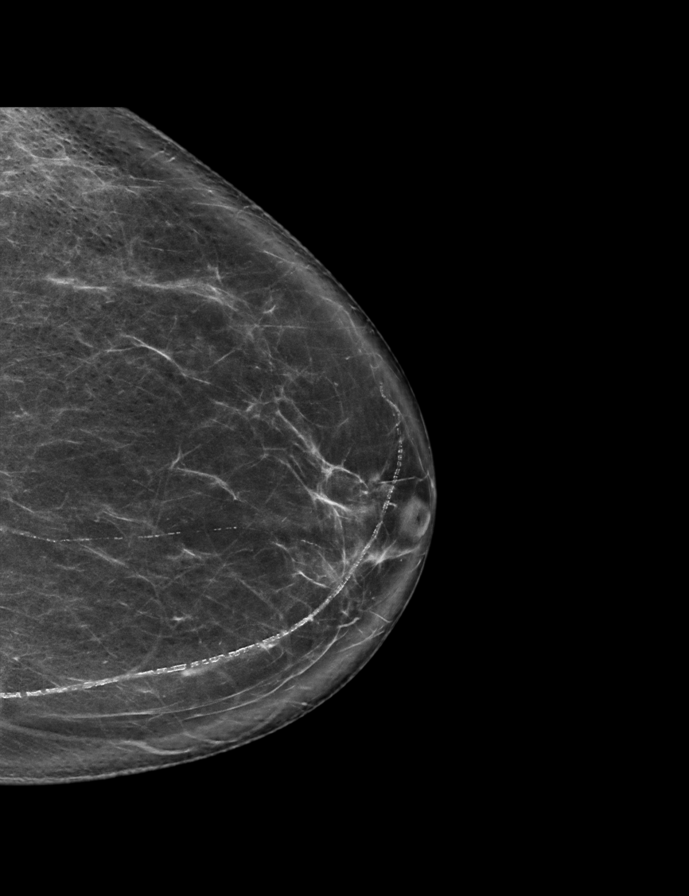

[L MLO synth-2D]
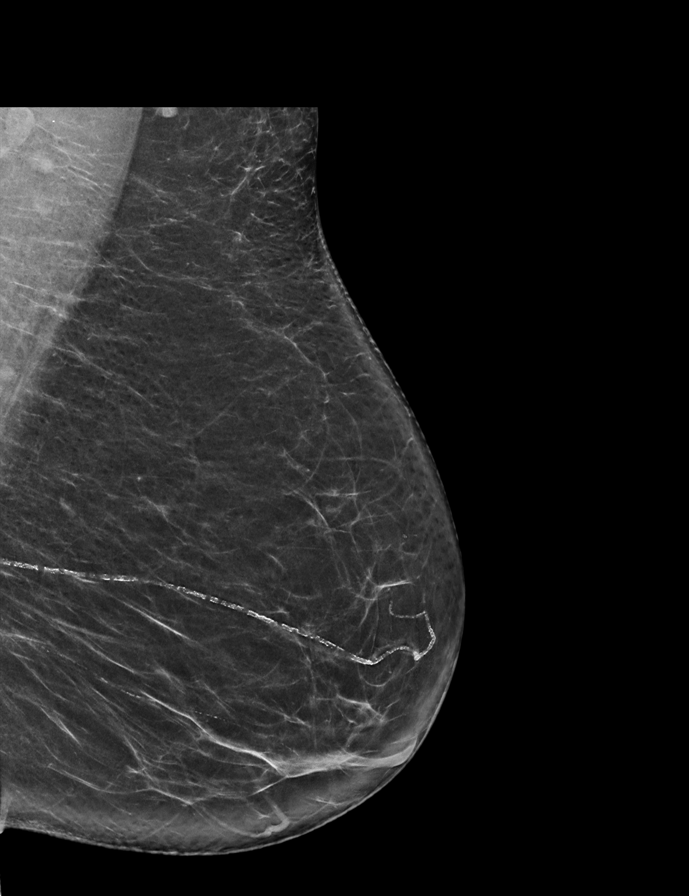

[R MLO synth-2D]
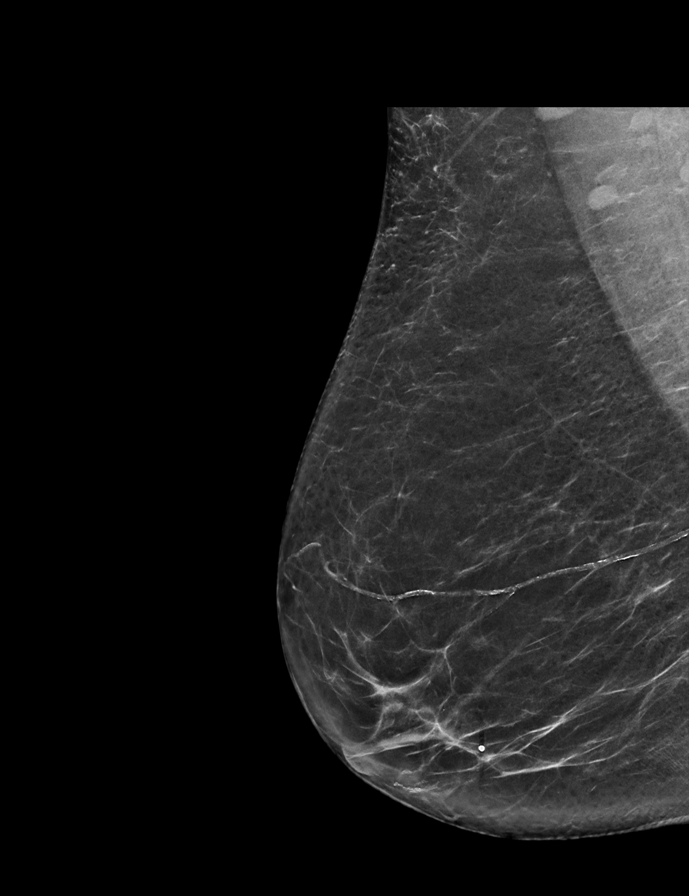

[R CC tomo · 2 of 75 frames shown]
[frame 25/75]
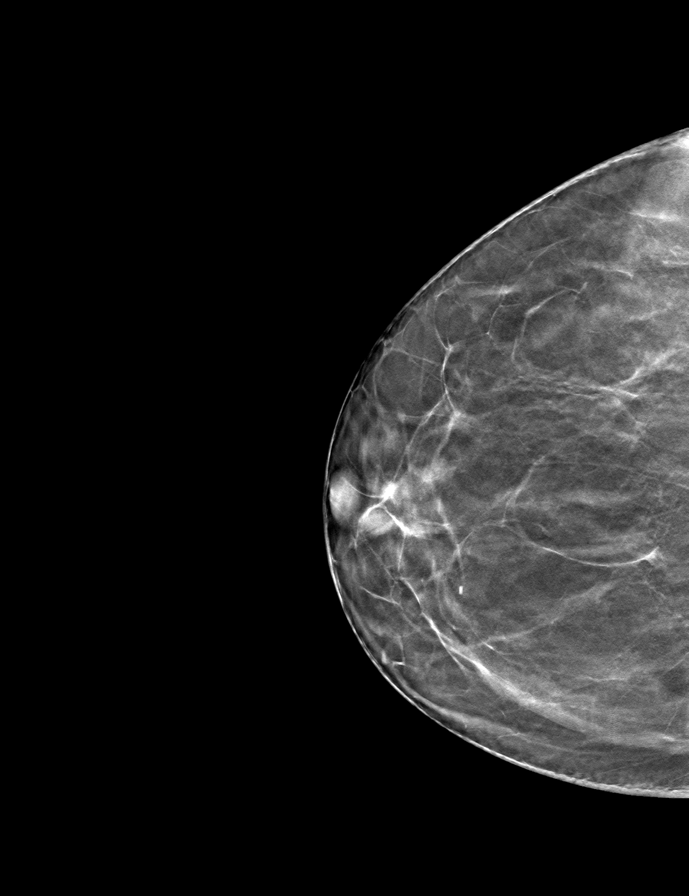
[frame 38/75]
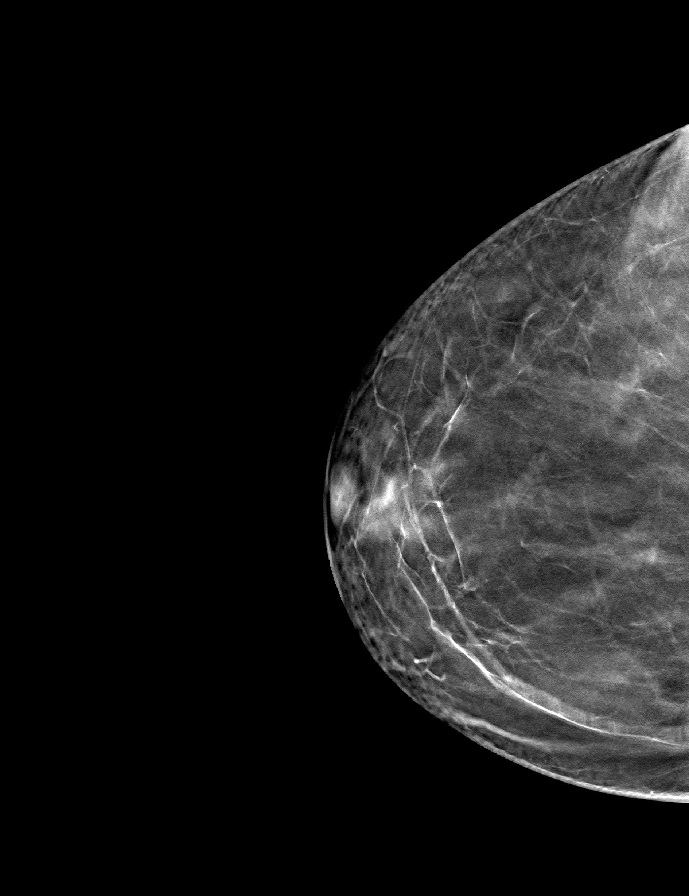

[L CC tomo · tomo slice 38/75.0]
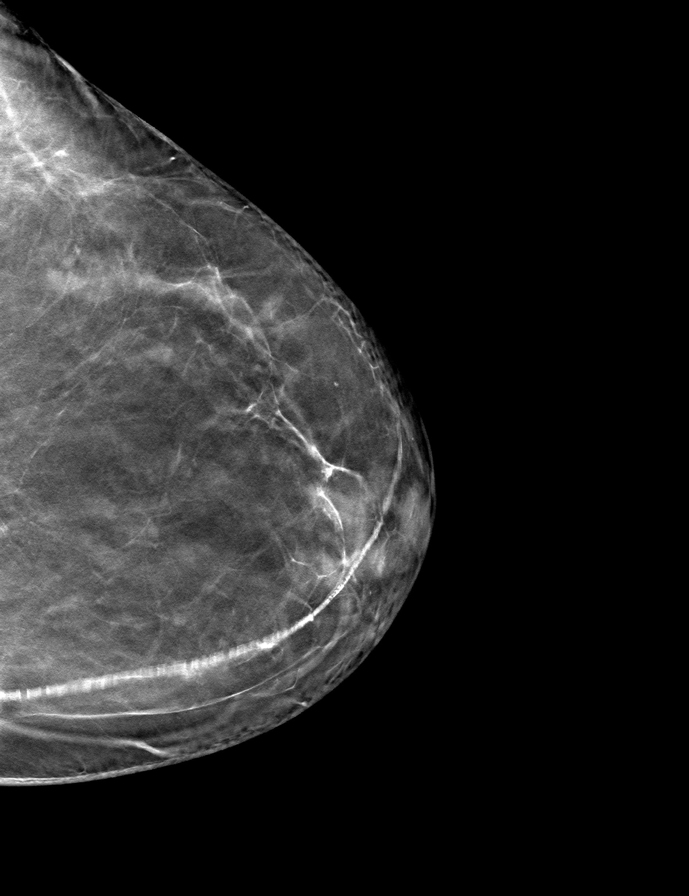

[L MLO tomo · tomo slice 37/73.0]
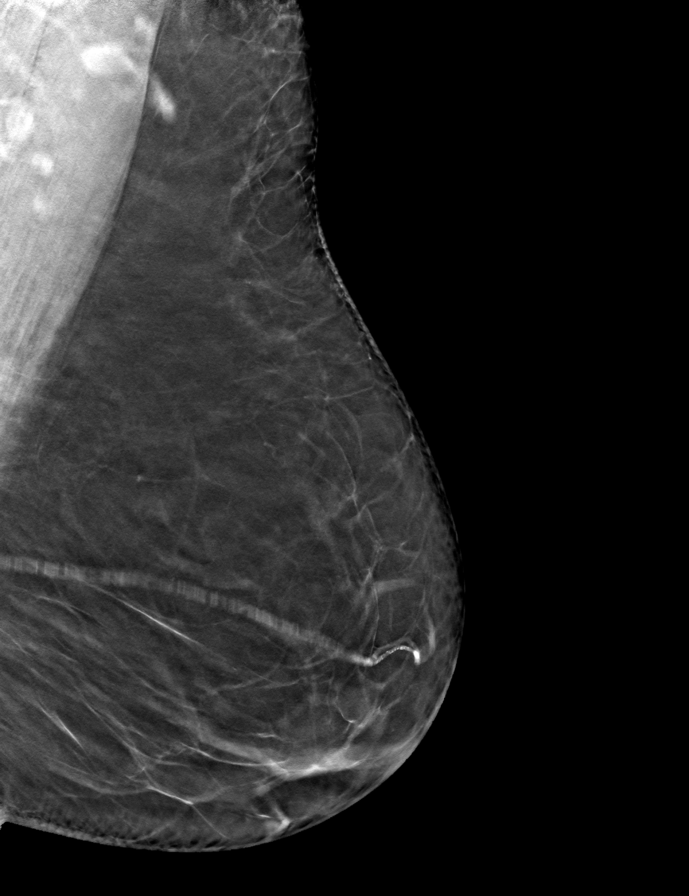

[R MLO tomo · tomo slice 36/71.0]
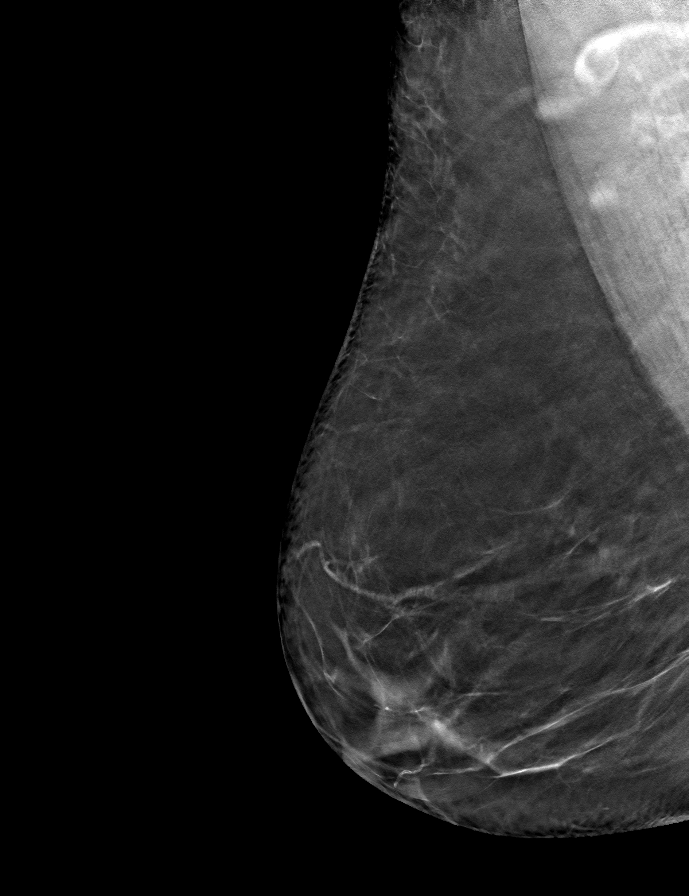

[9 of 24 positions shown; findings below may reference images not displayed]

ACR Breast Density Category b: There are scattered areas of
fibroglandular density.
FINDINGS: There are no findings suspicious for malignancy.
IMPRESSION: No mammographic evidence of malignancy. A result letter of this
screening mammogram will be mailed directly to the patient.

RECOMMENDATION:
Screening mammogram in one year. (Code:51-O-LD2)

BI-RADS CATEGORY  1: Negative.

## 2023-07-18 ENCOUNTER — Other Ambulatory Visit: Payer: Self-pay | Admitting: Family Medicine

## 2023-07-18 DIAGNOSIS — R519 Headache, unspecified: Secondary | ICD-10-CM | POA: Diagnosis not present

## 2023-07-18 DIAGNOSIS — E782 Mixed hyperlipidemia: Secondary | ICD-10-CM | POA: Diagnosis not present

## 2023-07-18 DIAGNOSIS — I10 Essential (primary) hypertension: Secondary | ICD-10-CM | POA: Diagnosis not present

## 2023-08-11 ENCOUNTER — Ambulatory Visit
Admission: RE | Admit: 2023-08-11 | Discharge: 2023-08-11 | Disposition: A | Discharge status: Home / Self Care | Payer: Medicare HMO | Source: Ambulatory Visit | Attending: Family Medicine | Admitting: Family Medicine

## 2023-08-11 DIAGNOSIS — R519 Headache, unspecified: Secondary | ICD-10-CM | POA: Diagnosis not present

## 2023-08-13 ENCOUNTER — Other Ambulatory Visit: Payer: Self-pay | Admitting: Family Medicine

## 2023-08-13 DIAGNOSIS — Z1231 Encounter for screening mammogram for malignant neoplasm of breast: Secondary | ICD-10-CM

## 2023-08-27 ENCOUNTER — Other Ambulatory Visit: Payer: Self-pay | Admitting: Cardiovascular Disease

## 2023-09-24 ENCOUNTER — Other Ambulatory Visit: Payer: Self-pay | Admitting: Cardiovascular Disease

## 2023-10-21 NOTE — Progress Notes (Unsigned)
 Cardiology Office Note  Date:  10/23/2023   ID:  Heidi Carter, Carter 09-21-1945, MRN [id number]  PCP:  Merri Brunette, MD   Chief Complaint  Patient presents with   12 month follow up     Patient c/o twinges in chest and fluttering in the evenings.     HPI:  Heidi Carter Carter is a 78 yo woman with PMHx of  HTN  seizures  2017 showing moderate aortic valve regurgitation  2018 with mild to moderate AI 2019 with mild AI Who presents for follow-up of her hypertension, aortic valve disease, palpitations  Last seen by myself in clinic December  2023 Palpitations at night,couple seconds Feels she is relatively asymptomatic from her palpitations Beta-blocker previously held for bradycardia  Some headaches Head MRI normal  Denies chest pain concerning for angina, no shortness of breath on exertion  Cardiac CTA 7/21  Coronary calcium score of 0. This was 0 percentile for age and sex matched control.  Normal coronary origin with right dominance.   Zio monitor completed, reviewed rare short episodes of tachycardia otherwise no significant arrhythmia  Echo 2019,  Normal EF, systolic function good Aortic valve with mild leak/regurg Was previously moderate leak by outside echo several years ago, then mild to moderate, now mild    tragic loss of her daughter in early 2020  EKG personally reviewed by myself on todays visit EKG Interpretation Date/Time:  Tuesday October 23 2023 08:53:33 EST Ventricular Rate:  57 PR Interval:  148 QRS Duration:  66 QT Interval:  442 QTC Calculation: 430 R Axis:   14  Text Interpretation: Sinus bradycardia ST & T wave abnormality, consider inferolateral ischemia When compared with ECG of 10-Feb-2015 14:55, No significant change was found Confirmed by Julien Nordmann [phone]) on 10/23/2023 9:11:48 AM     Review of prior echocardiogram from 2017 showing moderate aortic valve regurgitation normal ejection fraction Notes from Dr. Donnie Aho in December 2018  documenting mild to moderate AI  PMH:   has a past medical history of Chest pain, Diastolic dysfunction, Hyperlipidemia, Hypertension, Mitral regurgitation, Palpitations, and Seizures (HCC).  PSH:    Past Surgical History:  Procedure Laterality Date   ABDOMINAL HYSTERECTOMY     TUBAL LIGATION      Current Outpatient Medications  Medication Sig Dispense Refill   amLODipine (NORVASC) 10 MG tablet Take 10 mg by mouth daily.     aspirin EC 81 MG tablet Take 81 mg by mouth every morning.     carbamazepine (TEGRETOL) 200 MG tablet Take 200 mg by mouth daily.     cetirizine (ZYRTEC) 10 MG tablet Take 10 mg by mouth daily.     Cholecalciferol (VITAMIN D PO) Take 1 capsule by mouth daily.     ferrous fumarate (HEMOCYTE - 106 MG FE) 325 (106 FE) MG TABS tablet Take 1 tablet by mouth every morning.     pantoprazole (PROTONIX) 40 MG tablet Take 40 mg by mouth daily.      rosuvastatin (CRESTOR) 10 MG tablet Take 10 mg by mouth daily.     telmisartan (MICARDIS) 80 MG tablet Take 80 mg by mouth daily.     No current facility-administered medications for this visit.    Allergies:   Patient has no known allergies.   Social History:  The patient  reports that she has never smoked. She has never used smokeless tobacco. She reports that she does not drink alcohol and does not use drugs.   Family History:   family  history includes Clotting disorder in her sister.    Review of Systems: Review of Systems  Constitutional: Negative.   HENT: Negative.    Respiratory: Negative.    Cardiovascular: Negative.   Gastrointestinal: Negative.   Musculoskeletal: Negative.   Neurological: Negative.   Psychiatric/Behavioral: Negative.    All other systems reviewed and are negative.   PHYSICAL EXAM: VS:  BP (!) 150/60 (BP Location: Left Arm, Patient Position: Sitting, Cuff Size: Normal)   Pulse (!) 57   Ht 5\' 1"  (1.549 m)   Wt 119 lb 8 oz (54.2 kg)   SpO2 98%   BMI 22.58 kg/m  , BMI Body mass index is  22.58 kg/m. Constitutional:  oriented to person, place, and time. No distress.  HENT:  Head: Grossly normal Eyes:  no discharge. No scleral icterus.  Neck: No JVD, no carotid bruits  Cardiovascular: Regular rate and rhythm, no murmurs appreciated Pulmonary/Chest: Clear to auscultation bilaterally, no wheezes or rails Abdominal: Soft.  no distension.  no tenderness.  Musculoskeletal: Normal range of motion Neurological:  normal muscle tone. Coordination normal. No atrophy Skin: Skin warm and dry Psychiatric: normal affect, pleasant  Recent Labs: No results found for requested labs within last 365 days.    Lipid Panel No results found for: "CHOL", "HDL", "LDLCALC", "TRIG"    Wt Readings from Last 3 Encounters:  10/23/23 119 lb 8 oz (54.2 kg)  08/14/22 120 lb 6 oz (54.6 kg)  07/12/22 120 lb 4 oz (54.5 kg)     ASSESSMENT AND PLAN:  Benign essential HTN - Elevated numbers today, reports it is typically high on doctor visits  She has new blood pressure cuff and will check at home, no changes made to the medications.  Nonrheumatic aortic valve insufficiency - Reports indicating was moderate in 2017, outside study Mild to moderate 2018, outside study Mild in December 2019, available through our system for review No murmur on exam today No further workup needed at this time  Sinus bradycardia Beta-blocker ,bystolic stopped prior visit for bradycardia Asymptomatic  Palpitations Rare narrow complex tachycardia lasting several seconds at a time, not having significant burden on Zio monitor Avoid beta-blockers, minimally symptomatic    Orders Placed This Encounter  Procedures   EKG 12-Lead     Signed, Dossie Arbour, M.D., Ph.D. 10/23/2023  [facility] [place], [place] [phone]

## 2023-10-23 ENCOUNTER — Encounter: Payer: Self-pay | Admitting: Cardiovascular Disease

## 2023-10-23 ENCOUNTER — Ambulatory Visit: Payer: Medicare HMO | Attending: Cardiovascular Disease | Admitting: Cardiovascular Disease

## 2023-10-23 VITALS — BP 150/60 | HR 57 | Ht 61.0 in | Wt 119.5 lb

## 2023-10-23 DIAGNOSIS — E782 Mixed hyperlipidemia: Secondary | ICD-10-CM

## 2023-10-23 DIAGNOSIS — I10 Essential (primary) hypertension: Secondary | ICD-10-CM

## 2023-10-23 DIAGNOSIS — R002 Palpitations: Secondary | ICD-10-CM | POA: Diagnosis not present

## 2023-10-23 NOTE — Patient Instructions (Signed)

## 2023-11-16 ENCOUNTER — Ambulatory Visit
Admission: RE | Admit: 2023-11-16 | Discharge: 2023-11-16 | Disposition: A | Discharge status: Home / Self Care | Payer: Medicare HMO | Source: Ambulatory Visit | Attending: Family Medicine | Admitting: Family Medicine

## 2023-11-16 DIAGNOSIS — E2839 Other primary ovarian failure: Secondary | ICD-10-CM | POA: Diagnosis not present

## 2023-11-16 DIAGNOSIS — Z13820 Encounter for screening for osteoporosis: Secondary | ICD-10-CM

## 2023-11-16 DIAGNOSIS — N958 Other specified menopausal and perimenopausal disorders: Secondary | ICD-10-CM | POA: Diagnosis not present

## 2023-11-16 DIAGNOSIS — M8588 Other specified disorders of bone density and structure, other site: Secondary | ICD-10-CM | POA: Diagnosis not present

## 2023-12-01 DIAGNOSIS — H2513 Age-related nuclear cataract, bilateral: Secondary | ICD-10-CM | POA: Diagnosis not present

## 2023-12-01 DIAGNOSIS — H524 Presbyopia: Secondary | ICD-10-CM | POA: Diagnosis not present

## 2024-01-10 DIAGNOSIS — Z0000 Encounter for general adult medical examination without abnormal findings: Secondary | ICD-10-CM | POA: Diagnosis not present

## 2024-01-10 DIAGNOSIS — E782 Mixed hyperlipidemia: Secondary | ICD-10-CM | POA: Diagnosis not present

## 2024-01-10 DIAGNOSIS — I10 Essential (primary) hypertension: Secondary | ICD-10-CM | POA: Diagnosis not present

## 2024-01-10 DIAGNOSIS — G40909 Epilepsy, unspecified, not intractable, without status epilepticus: Secondary | ICD-10-CM | POA: Diagnosis not present

## 2024-01-10 DIAGNOSIS — K219 Gastro-esophageal reflux disease without esophagitis: Secondary | ICD-10-CM | POA: Diagnosis not present

## 2024-01-10 DIAGNOSIS — I700 Atherosclerosis of aorta: Secondary | ICD-10-CM | POA: Diagnosis not present

## 2024-01-10 DIAGNOSIS — R739 Hyperglycemia, unspecified: Secondary | ICD-10-CM | POA: Diagnosis not present

## 2024-01-10 DIAGNOSIS — Z1331 Encounter for screening for depression: Secondary | ICD-10-CM | POA: Diagnosis not present

## 2024-01-16 ENCOUNTER — Ambulatory Visit: Payer: Medicare HMO

## 2024-01-17 ENCOUNTER — Ambulatory Visit
Admission: RE | Admit: 2024-01-17 | Discharge: 2024-01-17 | Disposition: A | Discharge status: Home / Self Care | Source: Ambulatory Visit | Attending: Family Medicine | Admitting: Family Medicine

## 2024-01-17 DIAGNOSIS — Z1231 Encounter for screening mammogram for malignant neoplasm of breast: Secondary | ICD-10-CM

## 2024-07-16 DIAGNOSIS — R7303 Prediabetes: Secondary | ICD-10-CM | POA: Diagnosis not present

## 2024-07-16 DIAGNOSIS — M546 Pain in thoracic spine: Secondary | ICD-10-CM | POA: Diagnosis not present

## 2024-07-16 DIAGNOSIS — I10 Essential (primary) hypertension: Secondary | ICD-10-CM | POA: Diagnosis not present

## 2024-07-16 DIAGNOSIS — E782 Mixed hyperlipidemia: Secondary | ICD-10-CM | POA: Diagnosis not present

## 2024-07-16 DIAGNOSIS — K219 Gastro-esophageal reflux disease without esophagitis: Secondary | ICD-10-CM | POA: Diagnosis not present

## 2024-07-16 DIAGNOSIS — G40909 Epilepsy, unspecified, not intractable, without status epilepticus: Secondary | ICD-10-CM | POA: Diagnosis not present

## 2024-07-16 DIAGNOSIS — G8929 Other chronic pain: Secondary | ICD-10-CM | POA: Diagnosis not present

## 2024-08-25 ENCOUNTER — Other Ambulatory Visit: Payer: Self-pay | Admitting: Family Medicine

## 2024-08-25 DIAGNOSIS — Z1231 Encounter for screening mammogram for malignant neoplasm of breast: Secondary | ICD-10-CM

## 2024-10-27 ENCOUNTER — Ambulatory Visit: Admitting: Cardiovascular Disease

## 2025-01-20 ENCOUNTER — Ambulatory Visit
